# Patient Record
Sex: Male | Born: 1955 | Race: White | Hispanic: No | Marital: Married | Smoking: Never smoker
Health system: Southern US, Community
[De-identification: ages and names within clinical notes are randomized; demographics above are authoritative.]

## PROBLEM LIST (undated history)

## (undated) DIAGNOSIS — I4891 Unspecified atrial fibrillation: Secondary | ICD-10-CM

## (undated) DIAGNOSIS — Z9109 Other allergy status, other than to drugs and biological substances: Secondary | ICD-10-CM

## (undated) DIAGNOSIS — J3081 Allergic rhinitis due to animal (cat) (dog) hair and dander: Secondary | ICD-10-CM

## (undated) HISTORY — DX: Other allergy status, other than to drugs and biological substances: Z91.09

## (undated) HISTORY — DX: Allergic rhinitis due to animal (cat) (dog) hair and dander: J30.81

## (undated) HISTORY — DX: Unspecified atrial fibrillation: I48.91

---
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
## 2003-05-15 ENCOUNTER — Ambulatory Visit: Admission: RE | Admit: 2003-05-15 | Discharge: 2003-05-15 | Payer: Self-pay | Admitting: Family Medicine

## 2005-06-09 ENCOUNTER — Encounter: Admission: RE | Admit: 2005-06-09 | Discharge: 2005-06-09 | Payer: Self-pay | Admitting: *Deleted

## 2007-09-10 ENCOUNTER — Encounter: Admission: RE | Admit: 2007-09-10 | Discharge: 2007-09-10 | Payer: Self-pay | Admitting: Family Medicine

## 2008-02-14 ENCOUNTER — Encounter: Admission: RE | Admit: 2008-02-14 | Discharge: 2008-02-14 | Payer: Self-pay | Admitting: Emergency Medicine

## 2013-11-26 ENCOUNTER — Encounter (HOSPITAL_BASED_OUTPATIENT_CLINIC_OR_DEPARTMENT_OTHER): Payer: Self-pay | Admitting: Emergency Medicine

## 2013-11-26 ENCOUNTER — Emergency Department (HOSPITAL_BASED_OUTPATIENT_CLINIC_OR_DEPARTMENT_OTHER)
Admission: EM | Admit: 2013-11-26 | Discharge: 2013-11-26 | Disposition: A | Discharge status: Home / Self Care | Payer: PRIVATE HEALTH INSURANCE | Attending: Emergency Medicine | Admitting: Emergency Medicine

## 2013-11-26 DIAGNOSIS — T782XXA Anaphylactic shock, unspecified, initial encounter: Secondary | ICD-10-CM

## 2013-11-26 DIAGNOSIS — L508 Other urticaria: Secondary | ICD-10-CM | POA: Insufficient documentation

## 2013-11-26 DIAGNOSIS — T4995XA Adverse effect of unspecified topical agent, initial encounter: Secondary | ICD-10-CM | POA: Insufficient documentation

## 2013-11-26 MED ORDER — PREDNISONE 10 MG PO TABS: 20.0000 mg | ORAL_TABLET | Freq: Every day | ORAL | Status: DC

## 2013-11-26 MED ORDER — DIPHENHYDRAMINE HCL 25 MG PO CAPS: 25.0000 mg | ORAL_CAPSULE | Freq: Four times a day (QID) | ORAL | Status: DC

## 2013-11-26 MED ORDER — EPINEPHRINE HCL 0.1 MG/ML IJ SOSY
0.3000 mg | PREFILLED_SYRINGE | Freq: Once | INTRAMUSCULAR | Status: DC
  Filled 2013-11-26: qty 10

## 2013-11-26 MED ORDER — EPINEPHRINE 0.3 MG/0.3ML IJ SOAJ
0.3000 mg | Freq: Once | INTRAMUSCULAR | Status: DC
  Filled 2013-11-26: qty 0.6

## 2013-11-26 MED ORDER — EPINEPHRINE 0.3 MG/0.3ML IJ SOAJ: 0.3000 mg | INTRAMUSCULAR | Status: DC | PRN

## 2013-11-26 MED ORDER — DIPHENHYDRAMINE HCL 50 MG/ML IJ SOLN
25.0000 mg | Freq: Once | INTRAMUSCULAR | Status: AC
  Administered 2013-11-26: 25 mg via INTRAVENOUS
  Filled 2013-11-26: qty 1

## 2013-11-26 MED ORDER — RANITIDINE HCL 75 MG PO TABS: 75.0000 mg | ORAL_TABLET | Freq: Every day | ORAL | Status: DC

## 2013-11-26 MED ORDER — METHYLPREDNISOLONE SODIUM SUCC 125 MG IJ SOLR
125.0000 mg | Freq: Once | INTRAMUSCULAR | Status: AC
  Administered 2013-11-26: 125 mg via INTRAVENOUS
  Filled 2013-11-26: qty 2

## 2013-11-26 MED ORDER — EPINEPHRINE HCL 1 MG/ML IJ SOLN
0.3000 mg | Freq: Once | INTRAMUSCULAR | Status: AC
  Administered 2013-11-26: 0.3 mg via SUBCUTANEOUS
  Filled 2013-11-26: qty 1

## 2013-11-26 MED ORDER — FAMOTIDINE IN NACL 20-0.9 MG/50ML-% IV SOLN
20.0000 mg | Freq: Once | INTRAVENOUS | Status: AC
  Administered 2013-11-26: 20 mg via INTRAVENOUS
  Filled 2013-11-26: qty 50

## 2013-11-26 NOTE — ED Notes (Signed)
Woke w diarrhea eariler this am,  The short time later started having chills and itching,  Broke out in hives over body,no diff breathing or swallowing

## 2013-11-26 NOTE — ED Notes (Signed)
MD at bedside. 

## 2013-11-26 NOTE — Discharge Instructions (Signed)
Anaphylactic Reaction °An anaphylactic reaction is a sudden, severe allergic reaction that involves the whole body. It can be life threatening. A hospital stay is often required. People with asthma, eczema, or hay fever are slightly more likely to have an anaphylactic reaction. °CAUSES  °An anaphylactic reaction may be caused by anything to which you are allergic. After being exposed to the allergic substance, your immune system becomes sensitized to it. When you are exposed to that allergic substance again, an allergic reaction can occur. Common causes of an anaphylactic reaction include: °· Medicines. °· Foods, especially peanuts, wheat, shellfish, milk, and eggs. °· Insect bites or stings. °· Blood products. °· Chemicals, such as dyes, latex, and contrast material used for imaging tests. °SYMPTOMS  °When an allergic reaction occurs, the body releases histamine and other substances. These substances cause symptoms such as tightening of the airway. Symptoms often develop within seconds or minutes of exposure. Symptoms may include: °· Skin rash or hives. °· Itching. °· Chest tightness. °· Swelling of the eyes, tongue, or lips. °· Trouble breathing or swallowing. °· Lightheadedness or fainting. °· Anxiety or confusion. °· Stomach pains, vomiting, or diarrhea. °· Nasal congestion. °· A fast or irregular heartbeat (palpitations). °DIAGNOSIS  °Diagnosis is based on your history of recent exposure to allergic substances, your symptoms, and a physical exam. Your caregiver may also perform blood or urine tests to confirm the diagnosis. °TREATMENT  °Epinephrine medicine is the main treatment for an anaphylactic reaction. Other medicines that may be used for treatment include antihistamines, steroids, and albuterol. In severe cases, fluids and medicine to support blood pressure may be given through an intravenous line (IV). Even if you improve after treatment, you need to be observed to make sure your condition does not get  worse. This may require a stay in the hospital. °HOME CARE INSTRUCTIONS  °· Wear a medical alert bracelet or necklace stating your allergy. °· You and your family must learn how to use an anaphylaxis kit or give an epinephrine injection to temporarily treat an emergency allergic reaction. Always carry your epinephrine injection or anaphylaxis kit with you. This can be lifesaving if you have a severe reaction. °· Do not drive or perform tasks after treatment until the medicines used to treat your reaction have worn off, or until your caregiver says it is okay. °· If you have hives or a rash: °· Take medicines as directed by your caregiver. °· You may use an over-the-counter antihistamine (diphenhydramine) as needed. °· Apply cold compresses to the skin or take baths in cool water. Avoid hot baths or showers. °SEEK MEDICAL CARE IF:  °· You develop symptoms of an allergic reaction to a new substance. Symptoms may start right away or minutes later. °· You develop a rash, hives, or itching. °· You develop new symptoms. °SEEK IMMEDIATE MEDICAL CARE IF:  °· You have swelling of the mouth, difficulty breathing, or wheezing. °· You have a tight feeling in your chest or throat. °· You develop hives, swelling, or itching all over your body. °· You develop severe vomiting or diarrhea. °· You feel faint or pass out. °This is an emergency. Use your epinephrine injection or anaphylaxis kit as you have been instructed. Call your local emergency services (911 in [place] Even if you improve after the injection, you need to be examined at a hospital emergency department. °MAKE SURE YOU:  °· Understand these instructions. °· Will watch your condition. °· Will get help right away if you are not   doing well or get worse. Document Released: 06/19/2005 Document Revised: 12/19/2011 Document Reviewed: 09/20/2011 [facility] Patient Information 2014 [place], [place].

## 2013-11-26 NOTE — ED Notes (Signed)
Report received, pt care assumed.

## 2013-11-26 NOTE — ED Provider Notes (Signed)
CSN: [id number]     Arrival date & time 11/26/13  0636 History   First MD Initiated Contact with Patient 11/26/13 0703     Chief Complaint  Patient presents with  . Allergic Reaction     (Consider location/radiation/quality/duration/timing/severity/associated sxs/prior Treatment) Patient is a 58 y.o. male presenting with allergic reaction.  Allergic Reaction Presenting symptoms: itching and rash   Rash:    Location:  Full body   Quality: itchiness and swelling     Severity:  Moderate   Onset quality:  Gradual   Duration:  2 hours   Timing:  Constant   Progression:  Worsening Severity:  Moderate Context comment:  Recently moved from [place], stayed in a new apartment last night for the first time.   Relieved by:  Nothing Worsened by:  Nothing tried Ineffective treatments:  None tried   History reviewed. No pertinent past medical history. History reviewed. No pertinent past surgical history. No family history on file. History  Substance Use Topics  . Smoking status: Never Smoker   . Smokeless tobacco: Not on file  . Alcohol Use: No    Review of Systems  Constitutional: Negative for fever.  HENT: Negative for congestion.   Respiratory: Negative for cough and shortness of breath.   Cardiovascular: Negative for chest pain.  Gastrointestinal: Positive for abdominal pain (cramping) and diarrhea. Negative for nausea and vomiting.  Skin: Positive for itching and rash.  All other systems reviewed and are negative.     Allergies  Review of patient's allergies indicates no known allergies.  Home Medications   Prior to Admission medications   Not on File   BP 111/70  Pulse 80  Temp(Src) 98.7 F (37.1 C) (Oral)  Resp 20  Ht 5\' 10"  (1.778 m)  Wt 175 lb (79.379 kg)  BMI 25.11 kg/m2  SpO2 100% Physical Exam  Nursing note and vitals reviewed. Constitutional: He is oriented to person, place, and time. He appears well-developed and well-nourished. No distress.   HENT:  Head: Normocephalic and atraumatic.  Mouth/Throat: Oropharynx is clear and moist.  No angioedema  Eyes: Conjunctivae are normal. Pupils are equal, round, and reactive to light. No scleral icterus.  Neck: Neck supple.  Cardiovascular: Normal rate, regular rhythm, normal heart sounds and intact distal pulses.   No murmur heard. Pulmonary/Chest: Effort normal and breath sounds normal. No stridor. No respiratory distress. He has no wheezes. He has no rales.  Abdominal: Soft. He exhibits no distension. There is no tenderness.  Musculoskeletal: Normal range of motion. He exhibits no edema.  Neurological: He is alert and oriented to person, place, and time.  Skin: Skin is warm and dry. Rash noted. Rash is urticarial (diffuse).  Psychiatric: He has a normal mood and affect. His behavior is normal.    ED Course  Procedures (including critical care time) Labs Review Labs Reviewed - No data to display  Imaging Review No results found.   EKG Interpretation None      MDM   Final diagnoses:  Anaphylactic reaction    58 yo male with diffuse urticarial rash and persistent diarrhea/cramping abd pain/reflux.  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  He requires treatment for anaphylaxis with IM epi due to skin involvement and GI involvement.     8:35 AM Rash near resolved.  Diarrhea and cramping abdominal pain resolved shortly after epi.  Will continue to observe.    12:37 PM observed without problems.  Rash and GI symptoms gone without recurrence.  DC with epi pen and allergy fu.    Candyce ChurnJohn David Kseniya Grunden III, MD 11/26/13 [phone]

## 2013-11-26 NOTE — ED Notes (Signed)
Pt resting quielty, bedside commode placed at bedside, pt given call light and instructed to call for assistance with transferring. Pt states "I feel sleepy..." reports relief of abd cramping.

## 2013-11-26 NOTE — ED Notes (Signed)
Diarrhea onset this am, then hives over body  Denies sob or diff swallowing,  Staying in short term apartment  From [place]

## 2017-06-28 ENCOUNTER — Other Ambulatory Visit: Payer: Self-pay | Admitting: Family Medicine

## 2017-06-28 DIAGNOSIS — R609 Edema, unspecified: Secondary | ICD-10-CM

## 2017-07-02 ENCOUNTER — Ambulatory Visit
Admission: RE | Admit: 2017-07-02 | Discharge: 2017-07-02 | Disposition: A | Discharge status: Home / Self Care | Payer: No Typology Code available for payment source | Source: Ambulatory Visit | Attending: Family Medicine | Admitting: Family Medicine

## 2017-07-02 DIAGNOSIS — R609 Edema, unspecified: Secondary | ICD-10-CM

## 2017-07-02 MED ORDER — IOPAMIDOL (ISOVUE-300) INJECTION 61%
75.0000 mL | Freq: Once | INTRAVENOUS | Status: AC | PRN
  Administered 2017-07-02: 75 mL via INTRAVENOUS

## 2017-07-04 ENCOUNTER — Other Ambulatory Visit: Payer: Self-pay | Admitting: Gastroenterology

## 2017-07-04 DIAGNOSIS — Z800 Family history of malignant neoplasm of digestive organs: Secondary | ICD-10-CM

## 2017-07-17 ENCOUNTER — Other Ambulatory Visit: Payer: Self-pay | Admitting: Family Medicine

## 2017-07-17 DIAGNOSIS — R609 Edema, unspecified: Secondary | ICD-10-CM

## 2017-11-23 ENCOUNTER — Inpatient Hospital Stay: Admission: RE | Admit: 2017-11-23 | Payer: Self-pay | Source: Ambulatory Visit

## 2017-12-07 ENCOUNTER — Ambulatory Visit
Admission: RE | Admit: 2017-12-07 | Discharge: 2017-12-07 | Disposition: A | Discharge status: Home / Self Care | Payer: Self-pay | Source: Ambulatory Visit | Attending: Gastroenterology | Admitting: Gastroenterology

## 2017-12-07 DIAGNOSIS — Z800 Family history of malignant neoplasm of digestive organs: Secondary | ICD-10-CM

## 2018-01-29 ENCOUNTER — Ambulatory Visit (INDEPENDENT_AMBULATORY_CARE_PROVIDER_SITE_OTHER)
Admission: RE | Admit: 2018-01-29 | Discharge: 2018-01-29 | Disposition: A | Discharge status: Home / Self Care | Payer: Self-pay | Source: Ambulatory Visit | Attending: Pulmonary Disease | Admitting: Pulmonary Disease

## 2018-01-29 ENCOUNTER — Ambulatory Visit (INDEPENDENT_AMBULATORY_CARE_PROVIDER_SITE_OTHER): Payer: Self-pay | Admitting: Pulmonary Disease

## 2018-01-29 ENCOUNTER — Encounter: Payer: Self-pay | Admitting: Pulmonary Disease

## 2018-01-29 VITALS — BP 128/82 | HR 61 | Ht 70.0 in | Wt 179.0 lb

## 2018-01-29 DIAGNOSIS — R918 Other nonspecific abnormal finding of lung field: Secondary | ICD-10-CM

## 2018-01-29 DIAGNOSIS — R0683 Snoring: Secondary | ICD-10-CM

## 2018-01-29 NOTE — Progress Notes (Signed)
   Subjective:    Patient ID: Tim Curry, male    DOB: 06-13-1956, 62 y.o.   MRN: [id number]  HPI    Review of Systems  Constitutional: Negative for fever and unexpected weight change.  HENT: Negative for congestion, dental problem, ear pain, nosebleeds, postnasal drip, rhinorrhea, sinus pressure, sneezing, sore throat and trouble swallowing.   Eyes: Negative for redness and itching.  Respiratory: Negative for cough, chest tightness, shortness of breath and wheezing.   Cardiovascular: Negative for palpitations and leg swelling.  Gastrointestinal: Negative for nausea and vomiting.  Genitourinary: Negative for dysuria.  Musculoskeletal: Negative for joint swelling.  Skin: Negative for rash.  Allergic/Immunologic: Positive for environmental allergies. Negative for food allergies and immunocompromised state.  Neurological: Negative for headaches.  Hematological: Does not bruise/bleed easily.  Psychiatric/Behavioral: Negative for dysphoric mood. The patient is not nervous/anxious.        Objective:   Physical Exam        Assessment & Plan:

## 2018-01-29 NOTE — Patient Instructions (Signed)
Chest xray today  Will schedule CT chest for December 2020 and follow up after that

## 2018-01-29 NOTE — Progress Notes (Signed)
[facility]  Chief Complaint  Patient presents with  . New Consult    nodule     Constitutional: BP 128/82 (BP Location: Left Arm, Cuff Size: Normal)   Pulse 61   Ht 5\' 10"  (1.778 m)   Wt 179 lb (81.2 kg)   SpO2 98%   BMI 25.68 kg/m   History of Present Illness: Tim Curry is a 62 y.o. male with lung nodule.  His father had colon cancer.  He gets virtual colonoscopy for screening.  He had this done on 12/07/17.  This showed a 6 mm nodule in LLL on prone imaging (personally reviewed by me).  This increased in size compared to imaging from 2006.  He never smoked.  He denies history of pneumonia or tuberculosis.  He has allergies to dust and cats.  No history of asthma, or other respiratory conditions.    He is from [place].  [place]  He will be moving back to [place] permanently in 2020.    He does snore.  He is not sure if he stops breathing while asleep.   Comprehensive Respiratory Exam:  Appearance - well kempt  ENMT - nasal mucosa moist, turbinates clear, midline nasal septum, no dental lesions, no gingival bleeding, no oral exudates, 1+ tonsillar hypertrophy, scalloped tongue, elongated uvula Neck - no masses, trachea midline, no thyromegaly, no elevation in JVP Respiratory - normal appearance of chest wall, normal respiratory effort w/o accessory muscle use, no dullness on percussion, no wheezing or rales CV - s1s2 regular rate and rhythm, no murmurs, no peripheral edema, no varicosities, radial pulses symmetric GI - soft, non tender, no masses, no hepatosplenomegaly Lymph - no adenopathy noted in neck and axillary areas MSK - normal muscle strength and tone, normal gait Ext - no cyanosis, clubbing, or joint inflammation noted Skin - no rashes, lesions, or ulcers Neuro - oriented to person, place, and time Psych - normal mood and affect  Discussion: He has incidental finding of lung nodule in  Lt lower lung.  This has some increase in size compared to imaging studies in 2006.    Assessment/Plan:  Left lung nodule. - will arrange for chest xray today  - if CXR is okay, then will schedule CT chest w/o contrast for December 2020  Snoring. - asked him to monitor his sleep pattern, and then let us know if he thinks he would want to have a sleep study done   Patient Instructions  Chest xray today  Will schedule CT chest for December 2020 and follow up after that      Coralyn HellingVineet Khristian Phillippi, MD [facility] 01/29/2018, 10:28 AM  Flow Sheet  Pulmonary tests:  Review of Systems: Constitutional: Negative for fever and unexpected weight change.  HENT: Negative for congestion, dental problem, ear pain, nosebleeds, postnasal drip, rhinorrhea, sinus pressure, sneezing, sore throat and trouble swallowing.   Eyes: Negative for redness and itching.  Respiratory: Negative for cough, chest tightness, shortness of breath and wheezing.   Cardiovascular: Negative for palpitations and leg swelling.  Gastrointestinal: Negative for nausea and vomiting.  Genitourinary: Negative for dysuria.  Musculoskeletal: Negative for joint swelling.  Skin: Negative for rash.  Allergic/Immunologic: Positive for environmental allergies. Negative for food allergies and immunocompromised state.  Neurological: Negative for headaches.  Hematological: Does not bruise/bleed easily.  Psychiatric/Behavioral: Negative for dysphoric mood. The patient is not nervous/anxious.    Past Medical History: He  has a past  medical history of Cat allergies and House dust mite allergy.  Past Surgical History: He  has a past surgical history that includes Appendectomy (1970).  Family History: His family history includes Colon cancer in his father.  Social History: He  reports that he has never smoked. He has never used smokeless tobacco. He reports that he does not drink alcohol or use  drugs.  Medications: Allergies as of 01/29/2018   No Known Allergies     Medication List        Accurate as of 01/29/18 10:28 AM. Always use your most recent med list.          EPINEPHrine 0.3 mg/0.3 mL Soaj injection Commonly known as:  EPIPEN Inject 0.3 mLs (0.3 mg total) into the muscle as needed.

## 2018-02-05 ENCOUNTER — Other Ambulatory Visit: Payer: Self-pay | Admitting: Gastroenterology

## 2018-02-05 ENCOUNTER — Telehealth: Payer: Self-pay | Admitting: Pulmonary Disease

## 2018-02-05 DIAGNOSIS — Z800 Family history of malignant neoplasm of digestive organs: Secondary | ICD-10-CM

## 2018-02-05 NOTE — Telephone Encounter (Signed)
Dg Chest 2 View  Result Date: 01/29/2018 CLINICAL DATA:  History of left lower lobe pulmonary nodules. New patient appointment. No current chest complaints. Nonsmoker. EXAM: CHEST - 2 VIEW COMPARISON:  CT scan of the chest of December 07, 2017 FINDINGS: The lungs are mildly hyperinflated with mild hemidiaphragm flattening. The recently described pulmonary parenchymal nodules on the lung windows of the abdominal and pelvic CT scan of December 07, 2017 are not clearly evident on this plain radiograph. There is no alveolar infiltrate. There is no pleural effusion. The heart and pulmonary vascularity are normal. The bony thorax is unremarkable. IMPRESSION: Mild hyperinflation may be voluntary or may reflect reactive airway disease or chronic bronchitis. No pulmonary parenchymal nodules or masses are observed. No other acute cardiopulmonary abnormality. As recommended in June 2019, follow-up noncontrast chest CT scanning in 6-12 months is recommended. Electronically Signed   By: David  [place]azil[place] M.D.   On: 01/29/2018 15:19     Please let him know his CXR didn't show any obvious nodules.  No change to plan of having CT chest done in December 2020.

## 2018-02-14 NOTE — Telephone Encounter (Signed)
Attempted to call patient today regarding results. I did not receive an answer at time of call. I have left a voicemail message for pt to return call. X1  

## 2018-02-15 NOTE — Telephone Encounter (Signed)
Called and spoke with pt letting him know the results of the cxr and to let him know the plan was still the same to have the ct scan 06/2019.  Pt expressed understanding. Nothing further needed.

## 2018-02-15 NOTE — Telephone Encounter (Signed)
Pt is returning call. [phone].

## 2019-06-13 ENCOUNTER — Telehealth: Payer: Self-pay | Admitting: Pulmonary Disease

## 2019-06-13 DIAGNOSIS — R918 Other nonspecific abnormal finding of lung field: Secondary | ICD-10-CM

## 2019-06-13 NOTE — Telephone Encounter (Signed)
New order placed for the CT. Nothing further needed.

## 2019-07-08 ENCOUNTER — Other Ambulatory Visit (HOSPITAL_BASED_OUTPATIENT_CLINIC_OR_DEPARTMENT_OTHER): Payer: Self-pay | Admitting: Pulmonary Disease

## 2019-07-31 ENCOUNTER — Ambulatory Visit: Payer: Self-pay

## 2019-08-04 ENCOUNTER — Ambulatory Visit (HOSPITAL_BASED_OUTPATIENT_CLINIC_OR_DEPARTMENT_OTHER): Payer: Medicare Other

## 2019-08-05 ENCOUNTER — Ambulatory Visit: Payer: Self-pay

## 2019-08-08 ENCOUNTER — Telehealth: Payer: Self-pay | Admitting: Pulmonary Disease

## 2019-08-08 ENCOUNTER — Ambulatory Visit (HOSPITAL_BASED_OUTPATIENT_CLINIC_OR_DEPARTMENT_OTHER)
Admission: RE | Admit: 2019-08-08 | Discharge: 2019-08-08 | Disposition: A | Discharge status: Home / Self Care | Payer: Medicare Other | Source: Ambulatory Visit | Attending: Pulmonary Disease | Admitting: Pulmonary Disease

## 2019-08-08 ENCOUNTER — Other Ambulatory Visit: Payer: Self-pay

## 2019-08-08 DIAGNOSIS — R918 Other nonspecific abnormal finding of lung field: Secondary | ICD-10-CM | POA: Diagnosis present

## 2019-08-08 NOTE — Telephone Encounter (Signed)
CT chest 08/08/19 >> small HH, LLL nodule 5 mm, LLL nodule 4 mm, liver cyst, benign adrenal adenoma 1.3 cm   Please schedule ROV to review CT chest results.

## 2019-08-09 ENCOUNTER — Ambulatory Visit: Payer: Medicare Other | Attending: Internal Medicine

## 2019-08-09 DIAGNOSIS — Z23 Encounter for immunization: Secondary | ICD-10-CM | POA: Insufficient documentation

## 2019-08-09 NOTE — Progress Notes (Signed)
   Covid-19 Vaccination Clinic  Name:  ELGAR SCOGGINS    MRN: [id number] DOB: 07-01-1956  08/09/2019  Mr. Gravely was observed post Covid-19 immunization for 15 minutes without incidence. He was provided with Vaccine Information Sheet and instruction to access the V-Safe system.   Mr. Donaway was instructed to call 911 with any severe reactions post vaccine: Marland Kitchen Difficulty breathing  . Swelling of your face and throat  . A fast heartbeat  . A bad rash all over your body  . Dizziness and weakness    Immunizations Administered    Name Date Dose VIS Date Route   Pfizer COVID-19 Vaccine 08/09/2019 12:46 PM 0.3 mL 06/13/2019 Intramuscular   Manufacturer: [employer], [employer]   Lot: [id number]   NDC: [address]-1

## 2019-08-11 NOTE — Telephone Encounter (Signed)
Called the patient and advised Dr. Craige Cotta wanted to see him to discuss results.Patient scheduled for 08/14/19 at 11:30. Nothing further needed at this time.

## 2019-08-14 ENCOUNTER — Other Ambulatory Visit: Payer: Self-pay

## 2019-08-14 ENCOUNTER — Ambulatory Visit: Payer: Medicare Other | Admitting: Pulmonary Disease

## 2019-08-14 ENCOUNTER — Encounter: Payer: Self-pay | Admitting: Pulmonary Disease

## 2019-08-14 VITALS — BP 124/70 | HR 64 | Ht 70.0 in | Wt 180.4 lb

## 2019-08-14 DIAGNOSIS — R918 Other nonspecific abnormal finding of lung field: Secondary | ICD-10-CM

## 2019-08-14 NOTE — Patient Instructions (Signed)
Follow up as needed

## 2019-08-14 NOTE — Progress Notes (Signed)
[facility]  Chief Complaint  Patient presents with  . Follow-up    Patient is here to go over CT results. Patient feels good overall    Constitutional:  BP 124/70 (BP Location: Right Arm, Patient Position: Sitting, Cuff Size: Normal)   Pulse 64   Ht 5\' 10"  (1.778 m)   Wt 180 lb 6.4 oz (81.8 kg)   SpO2 99% Comment: on RA  BMI 25.88 kg/m   Past Medical History:  Allergies  Brief Summary:  Tim Curry is a 64 y.o. male with lung nodule.  Had CT chest earlier this month.  Stable nodule LLL.   Physical Exam:   Deferred.   Assessment/Plan:   Left lung nodule. - stable, benign - no additional follow up needed   Patient Instructions  Follow up as needed  Time spent 12 minutes  76, MD [facility] Pager: [phone] 08/14/2019, 12:18 PM  Flow Sheet     Pulmonary tests:  CT chest 08/08/19 >> small HH, LLL nodule 5 mm, LLL nodule 4 mm, liver cyst, benign adrenal adenoma 1.3 cm  Medications:   Allergies as of 08/14/2019   No Known Allergies     Medication List       Accurate as of August 14, 2019 12:18 PM. If you have any questions, ask your nurse or doctor.        EPINEPHrine 0.3 mg/0.3 mL Soaj injection Commonly known as: EpiPen Inject 0.3 mLs (0.3 mg total) into the muscle as needed.       Past Surgical History:  He  has a past surgical history that includes Appendectomy (1970).  Family History:  His family history includes Colon cancer in his father.  Social History:  He  reports that he has never smoked. He has never used smokeless tobacco. He reports that he does not drink alcohol or use drugs.

## 2019-09-02 ENCOUNTER — Ambulatory Visit: Payer: Medicare Other | Attending: Internal Medicine

## 2019-09-02 DIAGNOSIS — Z23 Encounter for immunization: Secondary | ICD-10-CM | POA: Insufficient documentation

## 2019-09-02 NOTE — Progress Notes (Signed)
   Covid-19 Vaccination Clinic  Name:  Tim Curry    MRN: [id number] DOB: June 01, 1956  09/02/2019  Tim Curry was observed post Covid-19 immunization for 15 minutes without incident. He was provided with Vaccine Information Sheet and instruction to access the V-Safe system.   Tim Curry was instructed to call 911 with any severe reactions post vaccine: Marland Kitchen Difficulty breathing  . Swelling of face and throat  . A fast heartbeat  . A bad rash all over body  . Dizziness and weakness   Immunizations Administered    Name Date Dose VIS Date Route   Pfizer COVID-19 Vaccine 09/02/2019 11:44 AM 0.3 mL 06/13/2019 Intramuscular   Manufacturer: [employer], [employer]   Lot: [id number]   NDC: [address]-0

## 2020-11-22 DIAGNOSIS — Z0000 Encounter for general adult medical examination without abnormal findings: Secondary | ICD-10-CM | POA: Insufficient documentation

## 2020-11-25 DIAGNOSIS — I700 Atherosclerosis of aorta: Secondary | ICD-10-CM | POA: Insufficient documentation

## 2020-11-25 DIAGNOSIS — R911 Solitary pulmonary nodule: Secondary | ICD-10-CM | POA: Insufficient documentation

## 2020-11-30 DIAGNOSIS — N4889 Other specified disorders of penis: Secondary | ICD-10-CM | POA: Insufficient documentation

## 2021-03-30 IMAGING — CT CT CHEST W/O CM
2 of 3 series · 15 of 36 positions shown, 18 images · non-contrast
Comparison: 12/07/2017

CLINICAL DATA: Follow-up pulmonary nodule identified on virtual
colonoscopy.

EXAM:
CT CHEST WITHOUT CONTRAST
TECHNIQUE: Multidetector CT imaging of the chest was performed following the
standard protocol without IV contrast.

[Series 2: thorax · axial · 0.85mm/px · z∈[-174,+148]mm · 12 of 190 slices shown, 15 images]
[im 15/190  mediastinal]
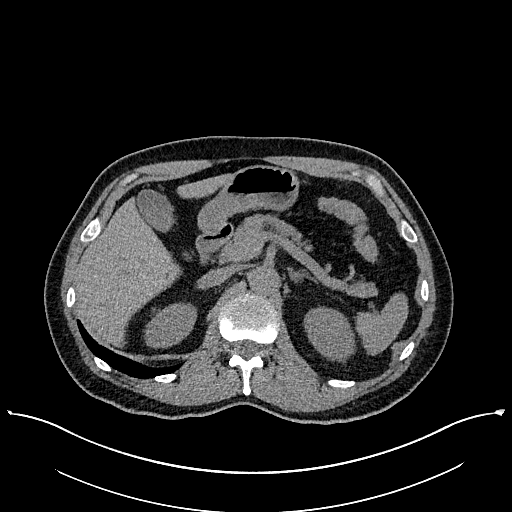
[im 15/190  lung]
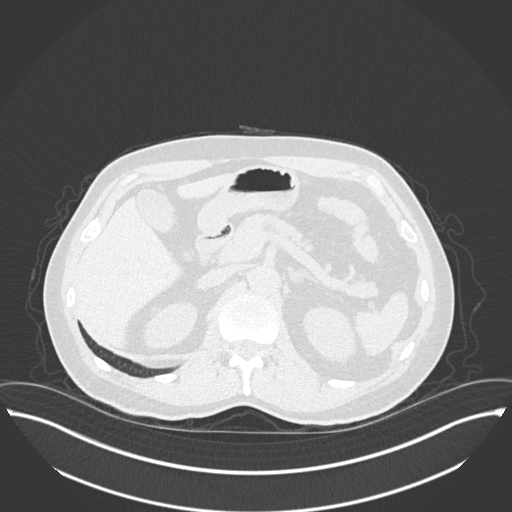
[im 29/190  lung]
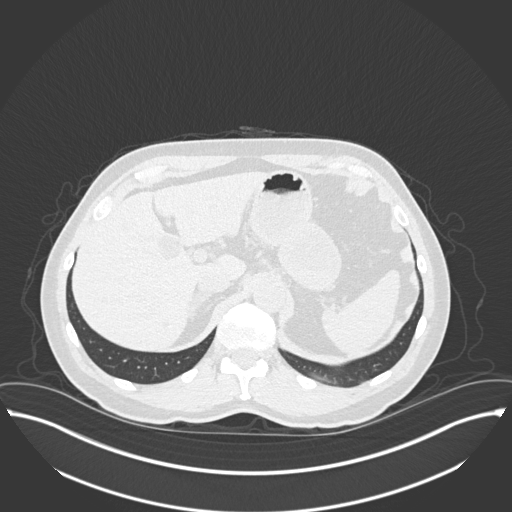
[im 43/190  lung]
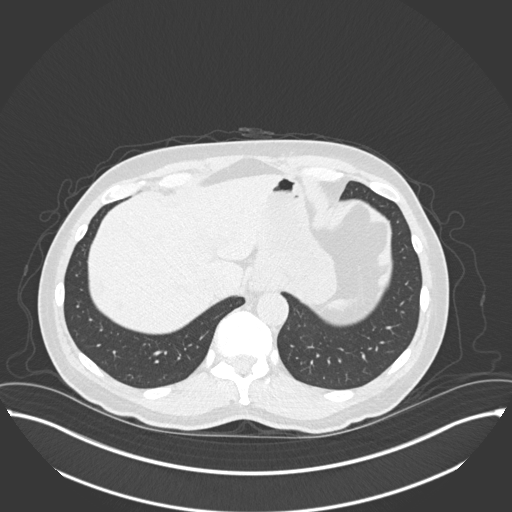
[im 57/190  lung]
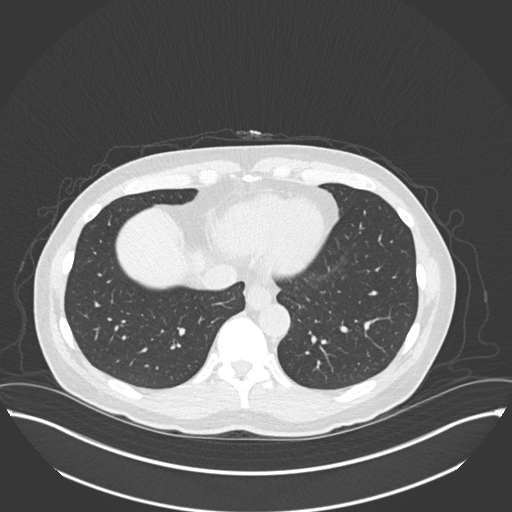
[im 71/190  mediastinal]
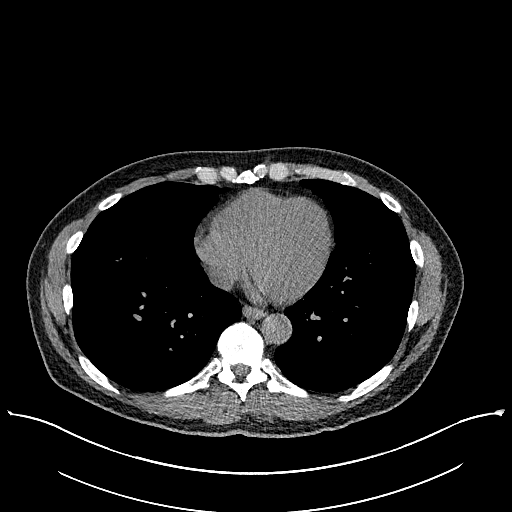
[im 71/190  lung]
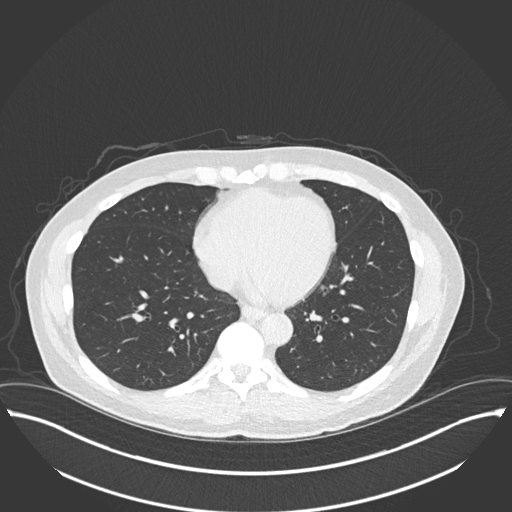
[im 85/190  lung]
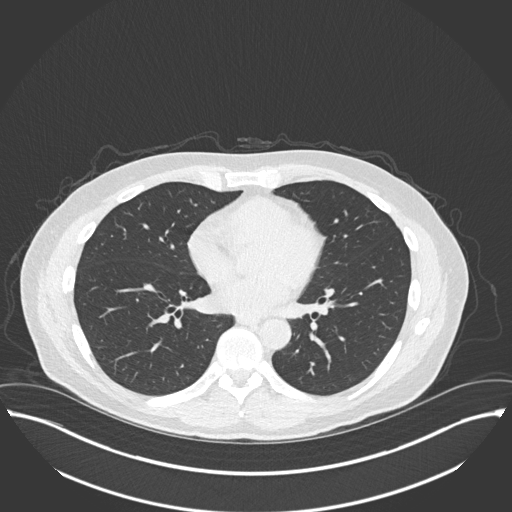
[im 106/190  lung]
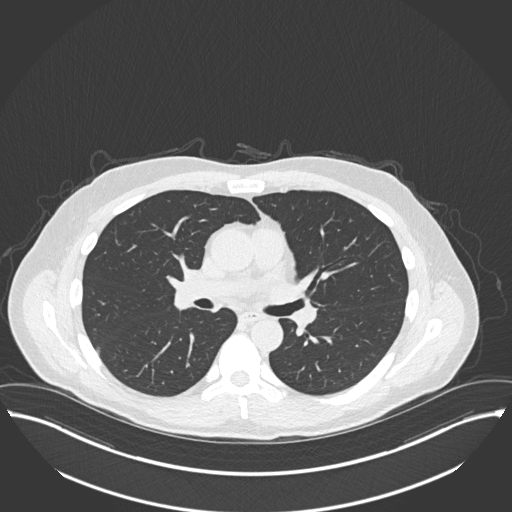
[im 120/190  lung]
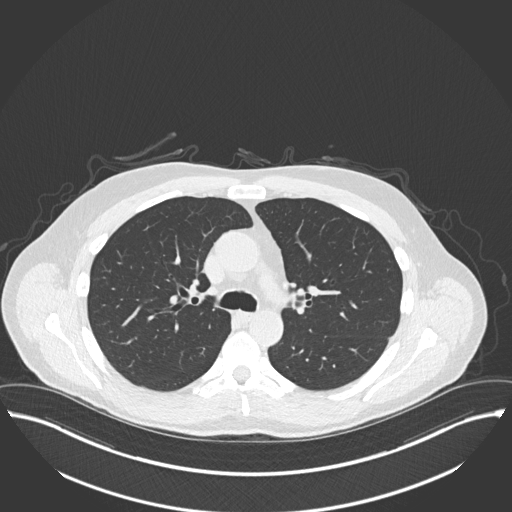
[im 134/190  mediastinal]
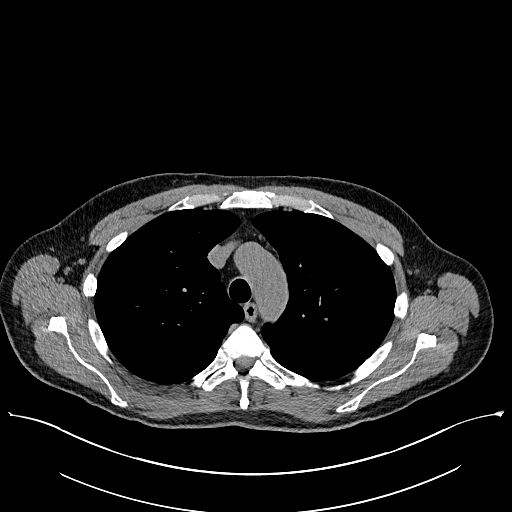
[im 134/190  lung]
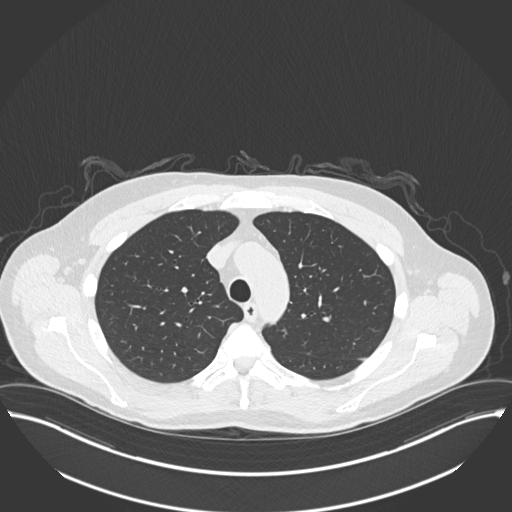
[im 148/190  lung]
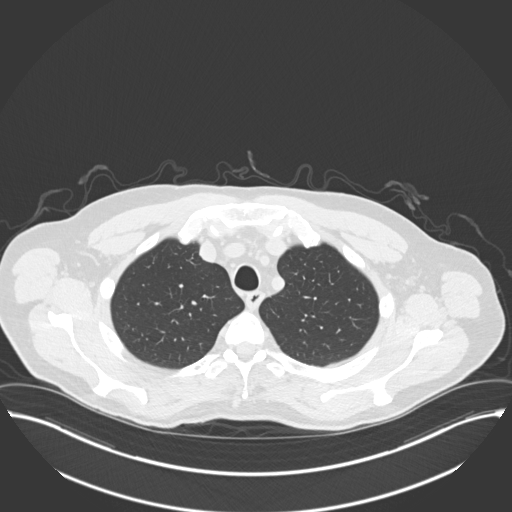
[im 162/190  lung]
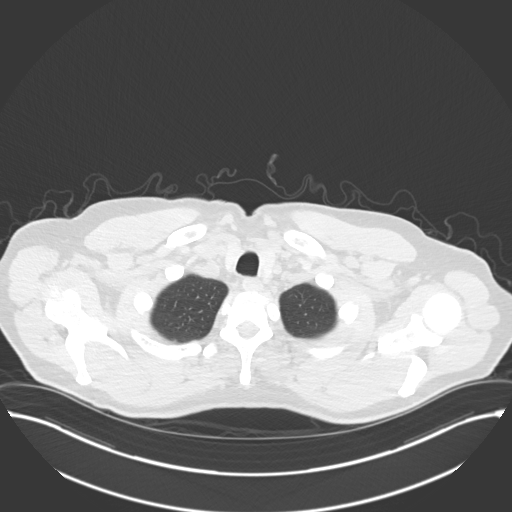
[im 176/190  lung]
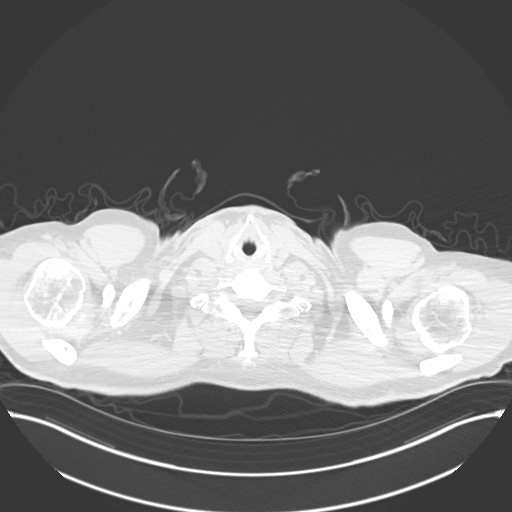

[Series 5: coronal · coronal · 0.74mm/px · 3 of 149 slices shown]
[im 30/149  lung]
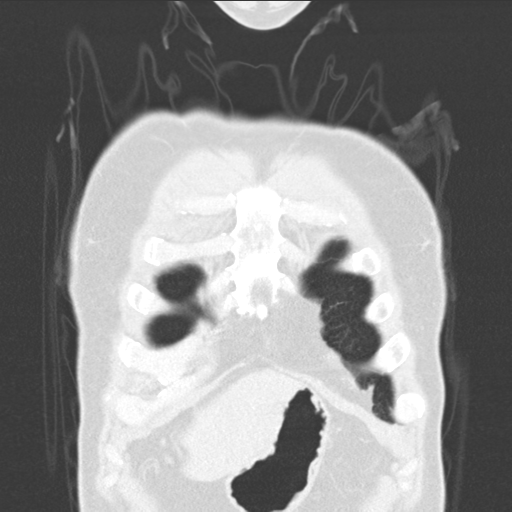
[im 60/149  lung]
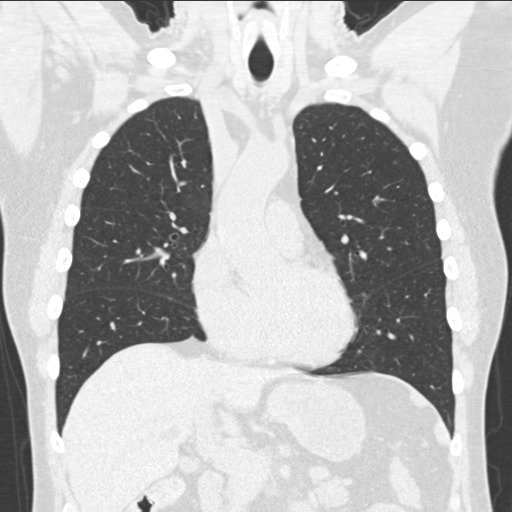
[im 89/149  lung]
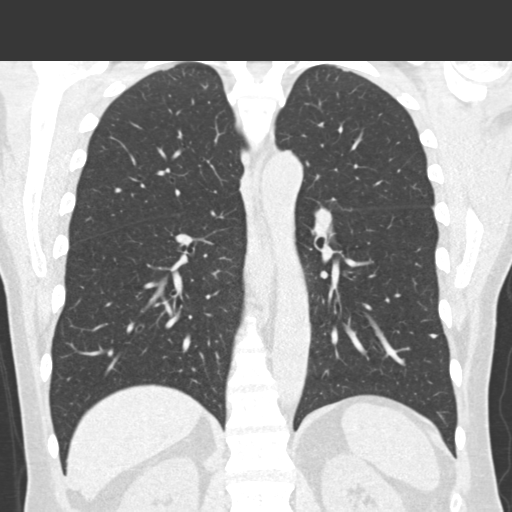

[15 of 36 positions shown; findings below may reference images not displayed]

FINDINGS: Cardiovascular: The heart size appears normal. No pericardial
effusion.

Mediastinum/Nodes: Normal appearance of the thyroid gland. The
trachea appears patent and is midline. Small hiatal hernia. No
axillary, mediastinal or hilar adenopathy.

Lungs/Pleura: No pleural effusion. No airspace consolidation,
atelectasis, or pneumothorax. Index nodule in the left lower lobe
measures 5 mm, image 128/3. Stable. The adjacent lung nodule
measures 4 mm, image 125/3.

Upper Abdomen: No acute abnormality.There are scattered
low-attenuation foci within the liver these were seen previously and
are favored to represent cysts. Low-attenuation right adrenal nodule
measures 1.3 cm and is compatible with a benign adenoma

Musculoskeletal: No chest wall mass or suspicious bone lesions
identified.
IMPRESSION: 1. No acute cardiopulmonary abnormalities.
2. Stable left lower lobe lung nodules measuring up to 5 mm.
Stability reflects a benign process. No further follow-up is
indicated at this time. This recommendation follows the consensus
statement: Guidelines for Management of Small Pulmonary Nodules
Detected on CT Images: From the [HOSPITAL] 0093; Radiology

## 2021-05-16 DIAGNOSIS — L570 Actinic keratosis: Secondary | ICD-10-CM | POA: Insufficient documentation

## 2021-07-07 DIAGNOSIS — Z9889 Other specified postprocedural states: Secondary | ICD-10-CM | POA: Insufficient documentation

## 2021-07-07 DIAGNOSIS — C44529 Squamous cell carcinoma of skin of other part of trunk: Secondary | ICD-10-CM | POA: Insufficient documentation

## 2021-07-07 DIAGNOSIS — M62830 Muscle spasm of back: Secondary | ICD-10-CM | POA: Insufficient documentation

## 2021-07-07 DIAGNOSIS — Z9049 Acquired absence of other specified parts of digestive tract: Secondary | ICD-10-CM | POA: Insufficient documentation

## 2021-11-02 DIAGNOSIS — R519 Headache, unspecified: Secondary | ICD-10-CM | POA: Insufficient documentation

## 2021-11-02 DIAGNOSIS — L299 Pruritus, unspecified: Secondary | ICD-10-CM | POA: Insufficient documentation

## 2021-11-02 DIAGNOSIS — R300 Dysuria: Secondary | ICD-10-CM | POA: Insufficient documentation

## 2022-04-20 DIAGNOSIS — L821 Other seborrheic keratosis: Secondary | ICD-10-CM | POA: Insufficient documentation

## 2022-04-20 DIAGNOSIS — M79646 Pain in unspecified finger(s): Secondary | ICD-10-CM | POA: Insufficient documentation

## 2022-06-22 DIAGNOSIS — R1030 Lower abdominal pain, unspecified: Secondary | ICD-10-CM | POA: Insufficient documentation

## 2022-07-14 DIAGNOSIS — R1032 Left lower quadrant pain: Secondary | ICD-10-CM | POA: Diagnosis not present

## 2022-08-09 DIAGNOSIS — L820 Inflamed seborrheic keratosis: Secondary | ICD-10-CM | POA: Diagnosis not present

## 2022-08-09 DIAGNOSIS — D2272 Melanocytic nevi of left lower limb, including hip: Secondary | ICD-10-CM | POA: Diagnosis not present

## 2022-08-09 DIAGNOSIS — D225 Melanocytic nevi of trunk: Secondary | ICD-10-CM | POA: Diagnosis not present

## 2022-08-09 DIAGNOSIS — C44519 Basal cell carcinoma of skin of other part of trunk: Secondary | ICD-10-CM | POA: Diagnosis not present

## 2022-08-09 DIAGNOSIS — L409 Psoriasis, unspecified: Secondary | ICD-10-CM | POA: Diagnosis not present

## 2022-08-09 DIAGNOSIS — Z85828 Personal history of other malignant neoplasm of skin: Secondary | ICD-10-CM | POA: Diagnosis not present

## 2022-08-09 DIAGNOSIS — L821 Other seborrheic keratosis: Secondary | ICD-10-CM | POA: Diagnosis not present

## 2022-08-09 DIAGNOSIS — Z86018 Personal history of other benign neoplasm: Secondary | ICD-10-CM | POA: Diagnosis not present

## 2022-08-09 DIAGNOSIS — L578 Other skin changes due to chronic exposure to nonionizing radiation: Secondary | ICD-10-CM | POA: Diagnosis not present

## 2022-08-09 DIAGNOSIS — L814 Other melanin hyperpigmentation: Secondary | ICD-10-CM | POA: Diagnosis not present

## 2022-08-09 DIAGNOSIS — L853 Xerosis cutis: Secondary | ICD-10-CM | POA: Diagnosis not present

## 2022-08-09 DIAGNOSIS — R1032 Left lower quadrant pain: Secondary | ICD-10-CM | POA: Diagnosis not present

## 2022-08-09 DIAGNOSIS — D485 Neoplasm of uncertain behavior of skin: Secondary | ICD-10-CM | POA: Diagnosis not present

## 2022-08-29 DIAGNOSIS — Z1211 Encounter for screening for malignant neoplasm of colon: Secondary | ICD-10-CM | POA: Diagnosis not present

## 2022-08-29 DIAGNOSIS — K644 Residual hemorrhoidal skin tags: Secondary | ICD-10-CM | POA: Diagnosis not present

## 2022-08-29 DIAGNOSIS — K648 Other hemorrhoids: Secondary | ICD-10-CM | POA: Diagnosis not present

## 2022-08-29 DIAGNOSIS — D122 Benign neoplasm of ascending colon: Secondary | ICD-10-CM | POA: Diagnosis not present

## 2022-08-29 DIAGNOSIS — K5730 Diverticulosis of large intestine without perforation or abscess without bleeding: Secondary | ICD-10-CM | POA: Diagnosis not present

## 2022-08-29 DIAGNOSIS — Z800 Family history of malignant neoplasm of digestive organs: Secondary | ICD-10-CM | POA: Diagnosis not present

## 2022-08-31 DIAGNOSIS — C44519 Basal cell carcinoma of skin of other part of trunk: Secondary | ICD-10-CM | POA: Diagnosis not present

## 2022-08-31 DIAGNOSIS — D122 Benign neoplasm of ascending colon: Secondary | ICD-10-CM | POA: Diagnosis not present

## 2022-09-13 DIAGNOSIS — H52223 Regular astigmatism, bilateral: Secondary | ICD-10-CM | POA: Diagnosis not present

## 2022-09-13 DIAGNOSIS — H53143 Visual discomfort, bilateral: Secondary | ICD-10-CM | POA: Diagnosis not present

## 2022-09-13 DIAGNOSIS — H524 Presbyopia: Secondary | ICD-10-CM | POA: Diagnosis not present

## 2022-11-03 DIAGNOSIS — Z131 Encounter for screening for diabetes mellitus: Secondary | ICD-10-CM | POA: Diagnosis not present

## 2022-11-03 DIAGNOSIS — E7800 Pure hypercholesterolemia, unspecified: Secondary | ICD-10-CM | POA: Diagnosis not present

## 2022-11-10 DIAGNOSIS — Z86010 Personal history of colonic polyps: Secondary | ICD-10-CM | POA: Diagnosis not present

## 2022-11-10 DIAGNOSIS — Z9189 Other specified personal risk factors, not elsewhere classified: Secondary | ICD-10-CM | POA: Diagnosis not present

## 2022-11-10 DIAGNOSIS — R002 Palpitations: Secondary | ICD-10-CM | POA: Diagnosis not present

## 2022-11-10 DIAGNOSIS — Z6824 Body mass index (BMI) 24.0-24.9, adult: Secondary | ICD-10-CM | POA: Diagnosis not present

## 2022-11-10 DIAGNOSIS — Z0000 Encounter for general adult medical examination without abnormal findings: Secondary | ICD-10-CM | POA: Diagnosis not present

## 2022-11-10 DIAGNOSIS — M5442 Lumbago with sciatica, left side: Secondary | ICD-10-CM | POA: Diagnosis not present

## 2022-11-10 DIAGNOSIS — Z800 Family history of malignant neoplasm of digestive organs: Secondary | ICD-10-CM | POA: Diagnosis not present

## 2022-11-13 ENCOUNTER — Other Ambulatory Visit (HOSPITAL_BASED_OUTPATIENT_CLINIC_OR_DEPARTMENT_OTHER): Payer: Self-pay | Admitting: Family Medicine

## 2022-11-13 DIAGNOSIS — Z9189 Other specified personal risk factors, not elsewhere classified: Secondary | ICD-10-CM

## 2022-11-23 ENCOUNTER — Ambulatory Visit: Payer: Medicare Other | Admitting: Cardiology

## 2022-11-23 ENCOUNTER — Encounter: Payer: Self-pay | Admitting: Cardiology

## 2022-11-23 VITALS — BP 137/73 | HR 69 | Resp 16 | Ht 70.0 in | Wt 173.0 lb

## 2022-11-23 DIAGNOSIS — R002 Palpitations: Secondary | ICD-10-CM | POA: Diagnosis not present

## 2022-11-23 NOTE — Progress Notes (Signed)
ID:  Tim Curry, DOB Nov 16, 1955, MRN [id number]  PCP:  Sigmund Hazel, MD  Cardiologist:  Tessa Lerner, DO, [facility] (established care 11/23/22)  REASON FOR CONSULT: Palpitations  REQUESTING PHYSICIAN:  Sigmund Hazel, MD [address] [place],  [place] [address]  Chief Complaint  Patient presents with   Palpitations   New Patient (Initial Visit)    HPI  Tim Curry is a 67 y.o. Caucasian male who presents to the clinic for evaluation of palpitations at the request of Sigmund Hazel, MD.   Since retirement in 2020 patient has started to exercise/walk more.  Initially when he would overexert he would notice fluttering/palpitations in his chest and associated with dyspnea.  However, over time his dyspnea and physical endurance improved and his fluttering/palpitation also improved.  However recently he started noticing palpitations towards the end of his walk or going up a flight of stairs.  He has a pulse oximeter at home and the pulse during these episodes could be as high as 180 bpm.  He is referred to cardiology for further evaluation and management.  During these episodes he has not had any near-syncope or syncopal events.  He denies anginal chest pain or heart failure symptoms.  He denies any consumption of coffee or caffeinated beverages.  No excessive consumption of alcohol.  Denies the use of weight loss supplements, herbal supplements, over-the-counter medications, or illicits.  No known diagnosis of thyroid disease or anemia.  FUNCTIONAL STATUS: 2-3 miles about 5 days a week.    ALLERGIES: No Known Allergies  MEDICATION LIST PRIOR TO VISIT: No outpatient medications have been marked as taking for the 11/23/22 encounter (Office Visit) with Tessa Lerner, DO.     PAST MEDICAL HISTORY: Past Medical History:  Diagnosis Date   Cat allergies    House dust mite allergy     PAST SURGICAL HISTORY: Past Surgical History:  Procedure Laterality Date   APPENDECTOMY  1970     FAMILY HISTORY: The patient family history includes Colon cancer in his father; Stroke in his father.  SOCIAL HISTORY:  The patient  reports that he has never smoked. He has never used smokeless tobacco. He reports that he does not drink alcohol and does not use drugs.  REVIEW OF SYSTEMS: Review of Systems  Cardiovascular:  Positive for palpitations. Negative for chest pain, claudication, dyspnea on exertion, irregular heartbeat, leg swelling, near-syncope, orthopnea, paroxysmal nocturnal dyspnea and syncope.  Respiratory:  Negative for shortness of breath.   Hematologic/Lymphatic: Negative for bleeding problem.  Musculoskeletal:  Negative for muscle cramps and myalgias.  Neurological:  Negative for dizziness and light-headedness.    PHYSICAL EXAM:    11/23/2022    3:00 PM 08/14/2019   11:52 AM 01/29/2018    9:51 AM  Vitals with BMI  Height 5\' 10"  5\' 10"  5\' 10"   Weight 173 lbs 180 lbs 6 oz 179 lbs  BMI 24.82 25.88 25.68  Systolic 137 124 010  Diastolic 73 70 82  Pulse 69 64 61    Physical Exam  Constitutional: No distress.  Age appropriate, hemodynamically stable.   Neck: No JVD present.  Cardiovascular: Normal rate, regular rhythm, S1 normal, S2 normal, intact distal pulses and normal pulses. Exam reveals no gallop, no S3 and no S4.  No murmur heard. Pulmonary/Chest: Effort normal and breath sounds normal. No stridor. He has no wheezes. He has no rales.  Abdominal: Soft. Bowel sounds are normal. He exhibits no distension. There is no abdominal tenderness.  Musculoskeletal:        General: No edema.     Cervical back: Neck supple.  Neurological: He is alert and oriented to person, place, and time. He has intact cranial nerves (2-12).  Skin: Skin is warm and moist.   CARDIAC DATABASE: EKG: Nov 23, 2022: Normal sinus rhythm, 67 bpm, LAE, right bundle branch block.  Echocardiogram: No results found for this or any previous visit from the past 1095 days.    Stress  Testing: No results found for this or any previous visit from the past 1095 days.   Heart Catheterization: None  LABORATORY DATA: External Labs: Collected: Nov 03, 2022 provided by PCP. Total cholesterol 152, triglycerides 38, HDL 49, LDL calculated 93, non-HDL 102. Glucose 97. PSA 2.8  Lab results collected: July 14, 2022 provided by the patient. BUN 27, creatinine 1.1. Sodium 141, potassium 4.5, chloride 107, bicarb 31. AST 16, ALT 15, alkaline phosphatase 57  IMPRESSION:    ICD-10-CM   1. Palpitations  R00.2 EKG 12-Lead    LONG TERM MONITOR (3-14 DAYS)    TSH    Hemoglobin and hematocrit, blood       RECOMMENDATIONS: Tim Curry is a 67 y.o. Caucasian male without any significant past medical history who presents today for evaluation of palpitations.  Palpitations EKG illustrates sinus rhythm without ectopy. Outside labs independently reviewed. No identifiable reversible cause. Labs suggestive of possible dehydration which may be contributory.  Patient acknowledges that he could improve his hydration. 2-week cardiac monitor to evaluate for any dysrhythmias. If the Zio patch is unremarkable we discussed alternatives such as smart watch technology, Kardia mobile, or implantable loop recorder to monitor for dysrhythmias long-term.  However, patient states that he is unable to wear watches which raises the question for possible nickel allergy.  In such cases would recommend Kardia mobile and long-term monitoring if needed. Check TSH and hemoglobin to evaluate for thyroid disease and anemia respectively.  Primary care has ordered coronary calcium score which is currently scheduled for Nov 28, 2022.  Data Reviewed: I have independently reviewed external notes provided by the referring provider as part of this office visit.   I have independently reviewed results of EKG, Labs, as part of medical decision making. I have ordered the following tests:  Orders Placed This  Encounter  Procedures   TSH   Hemoglobin and hematocrit, blood   LONG TERM MONITOR (3-14 DAYS)    Standing Status:   Future    Standing Expiration Date:   11/23/2023    Order Specific Question:   Where should this test be performed?    Answer:   [facility]    Order Specific Question:   Does the patient have an implanted cardiac device?    Answer:   No    Order Specific Question:   Prescribed days of wear    Answer:   85    Order Specific Question:   Type of enrollment    Answer:   Clinic Enrollment   EKG 12-Lead   I have not made medications changes at today's encounter as noted above.  FINAL MEDICATION LIST END OF ENCOUNTER: No orders of the defined types were placed in this encounter.   Medications Discontinued During This Encounter  Medication Reason   EPINEPHrine (EPIPEN) 0.3 mg/0.3 mL IJ SOAJ injection     No current outpatient medications on file.  Orders Placed This Encounter  Procedures   TSH   Hemoglobin and hematocrit, blood   LONG  TERM MONITOR (3-14 DAYS)   EKG 12-Lead    There are no Patient Instructions on file for this visit.   --Continue cardiac medications as reconciled in final medication list. --Return in about 6 weeks (around 01/04/2023) for Follow up, Palpitations. or sooner if needed. --Continue follow-up with your primary care physician regarding the management of your other chronic comorbid conditions.  Patient's questions and concerns were addressed to his satisfaction. He voices understanding of the instructions provided during this encounter.   This note was created using a voice recognition software as a result there may be grammatical errors inadvertently enclosed that do not reflect the nature of this encounter. Every attempt is made to correct such errors.  Tessa Lerner, [place], [facility]  Pager:  [phone] Office: [phone]

## 2022-11-28 ENCOUNTER — Inpatient Hospital Stay (HOSPITAL_BASED_OUTPATIENT_CLINIC_OR_DEPARTMENT_OTHER): Admission: RE | Admit: 2022-11-28 | Payer: Medicare Other | Source: Ambulatory Visit

## 2022-12-07 ENCOUNTER — Ambulatory Visit (HOSPITAL_BASED_OUTPATIENT_CLINIC_OR_DEPARTMENT_OTHER)
Admission: RE | Admit: 2022-12-07 | Discharge: 2022-12-07 | Disposition: A | Discharge status: Home / Self Care | Payer: Medicare Other | Source: Ambulatory Visit | Attending: Family Medicine | Admitting: Family Medicine

## 2022-12-07 DIAGNOSIS — Z9189 Other specified personal risk factors, not elsewhere classified: Secondary | ICD-10-CM | POA: Insufficient documentation

## 2022-12-28 DIAGNOSIS — L910 Hypertrophic scar: Secondary | ICD-10-CM | POA: Diagnosis not present

## 2023-01-03 ENCOUNTER — Ambulatory Visit: Payer: Medicare Other

## 2023-01-03 DIAGNOSIS — R002 Palpitations: Secondary | ICD-10-CM

## 2023-01-08 DIAGNOSIS — R002 Palpitations: Secondary | ICD-10-CM | POA: Diagnosis not present

## 2023-01-09 LAB — TSH: TSH: 0.791 u[IU]/mL (ref 0.450–4.500)

## 2023-01-09 LAB — HEMOGLOBIN AND HEMATOCRIT, BLOOD
Hematocrit: 41.3 % (ref 37.5–51.0)
Hemoglobin: 13.9 g/dL (ref 13.0–17.7)

## 2023-01-11 DIAGNOSIS — H6123 Impacted cerumen, bilateral: Secondary | ICD-10-CM | POA: Diagnosis not present

## 2023-01-11 DIAGNOSIS — R0981 Nasal congestion: Secondary | ICD-10-CM | POA: Diagnosis not present

## 2023-01-12 ENCOUNTER — Ambulatory Visit: Payer: Medicare Other | Admitting: Cardiology

## 2023-01-18 ENCOUNTER — Ambulatory Visit: Payer: Medicare Other | Admitting: Cardiology

## 2023-01-29 ENCOUNTER — Telehealth: Payer: Self-pay

## 2023-01-29 DIAGNOSIS — R002 Palpitations: Secondary | ICD-10-CM | POA: Diagnosis not present

## 2023-01-29 DIAGNOSIS — I480 Paroxysmal atrial fibrillation: Secondary | ICD-10-CM

## 2023-01-29 NOTE — Telephone Encounter (Signed)
Zio rep called to let us know that patient monitor showed Rapid a-fib on 07/16@11 :30 am and had 198 beat per min lasted 60 second, you are able to see it on page 6

## 2023-01-31 DIAGNOSIS — R002 Palpitations: Secondary | ICD-10-CM | POA: Diagnosis not present

## 2023-01-31 MED ORDER — ASPIRIN 81 MG PO TBEC
81.0000 mg | DELAYED_RELEASE_TABLET | Freq: Every day | ORAL | 0 refills | Status: AC
Start: 2023-01-31 — End: 2023-05-01

## 2023-01-31 MED ORDER — DILTIAZEM HCL ER BEADS 120 MG PO CP24
120.0000 mg | ORAL_CAPSULE | Freq: Every day | ORAL | 0 refills | Status: DC
Start: 2023-01-31 — End: 2023-02-26

## 2023-01-31 NOTE — Telephone Encounter (Signed)
ON-CALL CARDIOLOGY 01/31/23  Patient's name: Tim Curry.   MRN: [id number].    DOB: Apr 22, 1956 Primary care provider: Sigmund Hazel, MD. Primary cardiologist: Tessa Lerner, DO, [facility]  Interaction regarding this patient's care today: Patient had a Zio patch as part of his palpitation workup.  Results were recently posted and he was noted to have asymptomatic episodes of atrial fibrillation with rapid ventricular rate.   Cardiac monitor [facility] Patch):  January 03, 2023 -January 17 2023  Dominant rhythm sinus, followed by atrial fibrillation (2% burden).  Sinus: Heart rate 44-129 bpm.  Avg HR 70 bpm.  Atrial fibrillation: Heart rate 64-234 bpm.  Avg HR 134 bpm.  Total A-fib burden 2%.  Longest episode of atrial fibrillation: 01/10/2023, 3 hours 25 minutes, average heart rate 117 bpm, max heart rate 197 bpm  No supraventricular tachycardia, ventricular tachycardia, high grade AV block, pauses (3 seconds or longer).  Total ventricular ectopic burden 4.9%.  Total supraventricular ectopic burden <1%.  Patient triggered events: 0.   Since the longest episode was 3 hours and 25 minutes recommend rate control strategy for now.  CHA2DS2-VASc SCORE is 1 which correlates to 0.6 % risk of stroke per year (age).   Impression:   ICD-10-CM   1. Paroxysmal atrial fibrillation (HCC)  I48.0 diltiazem (TIAZAC) 120 MG 24 hr capsule    aspirin EC 81 MG tablet    [facility] ECHOCARDIOGRAM COMPLETE      Recommendations:  Paroxysmal atrial fibrillation (HCC) Noted on Zio patch performed as part of palpitation workup. Rate control: Diltiazem 120 mg p.o. daily Rhythm control: N/A Thromboembolic prophylaxis: Aspirin [id number] SCORE is 1 which correlates to 0.6 % risk of stroke per year (age).  Echo will be ordered to evaluate for structural heart disease and left ventricular systolic function.  I have also asked him to keep a log of his blood pressures and pulse.  He also understands that if he develops  comorbidities his CHA2DS2-VASc score may indicate him to be on blood thinners.  This will need to be followed up on the longitudinal basis.   Telephone encounter total time: 10 minutes   Delilah Shan [facility]  Pager: [phone] Office: [phone]

## 2023-01-31 NOTE — Telephone Encounter (Signed)
Tim Curry,   Spoke to patient over the phone.  Can you arrange the echo prior to next office visit?  Malaka Ruffner Richards, DO, [facility]

## 2023-02-08 ENCOUNTER — Ambulatory Visit: Payer: Medicare Other | Admitting: Cardiology

## 2023-02-08 ENCOUNTER — Encounter: Payer: Self-pay | Admitting: Cardiology

## 2023-02-08 VITALS — BP 127/67 | HR 53 | Resp 16 | Ht 70.0 in | Wt 173.0 lb

## 2023-02-08 DIAGNOSIS — I480 Paroxysmal atrial fibrillation: Secondary | ICD-10-CM

## 2023-02-08 DIAGNOSIS — R002 Palpitations: Secondary | ICD-10-CM | POA: Diagnosis not present

## 2023-02-08 NOTE — Progress Notes (Signed)
ID:  Tim Curry, DOB Jun 22, 1956, MRN [id number]  PCP:  Sigmund Hazel, MD  Cardiologist:  Tessa Lerner, DO, [facility] (established care 11/23/22)  Date: 02/08/23 Last Office Visit: 11/23/2022  Chief Complaint  Patient presents with   Follow-up   Atrial Fibrillation    HPI  Tim Curry is a 67 y.o. Caucasian male whose past medical history includes paroxysmal atrial fibrillation.  After his retirement in 2020 patient started walking and exercising more and recently with overexertion and was noticing fluttering/palpitations in the chest associated with dyspnea on exertion.  However, once his endurance improved the symptoms had essentially resolved.  Prior to establishing care in May 2024 patient noted that at the end of his walks or going up a flight of stairs he would have palpitations with a heart rate as high as 180 bpm.  He was referred to cardiology for further evaluation and management.  At last office visit the shared decision was to proceed with echocardiogram and Zio patch.  Echocardiogram still pending.  Zio patch results were notable for newly discovered atrial fibrillation.  I had spoken to him in the interim and the shared decision was to check labs including TSH (which is normal) and started on diltiazem for rate control strategy.  The patient states that he is doing well on diltiazem and his heart rate has improved.  However he has nonspecific symptoms such as feeling negative (no depression or suicidal ideations), wanting to nap on a regular basis, and some musculoskeletal discomfort in the posterior shoulder towards the occiput.  FUNCTIONAL STATUS: 2-3 miles about 5 days a week.    ALLERGIES: No Known Allergies  MEDICATION LIST PRIOR TO VISIT: Current Meds  Medication Sig   aspirin EC 81 MG tablet Take 1 tablet (81 mg total) by mouth daily. Swallow whole.   diltiazem (TIAZAC) 120 MG 24 hr capsule Take 1 capsule (120 mg total) by mouth daily.     PAST MEDICAL  HISTORY: Past Medical History:  Diagnosis Date   Atrial fibrillation (HCC)    Cat allergies    House dust mite allergy     PAST SURGICAL HISTORY: Past Surgical History:  Procedure Laterality Date   APPENDECTOMY  1970    FAMILY HISTORY: The patient family history includes Colon cancer in his father; Stroke in his father.  SOCIAL HISTORY:  The patient  reports that he has never smoked. He has never used smokeless tobacco. He reports that he does not drink alcohol and does not use drugs.  REVIEW OF SYSTEMS: Review of Systems  Cardiovascular:  Negative for chest pain, claudication, dyspnea on exertion, irregular heartbeat, leg swelling, near-syncope, orthopnea, palpitations, paroxysmal nocturnal dyspnea and syncope.  Respiratory:  Negative for shortness of breath.   Hematologic/Lymphatic: Negative for bleeding problem.  Musculoskeletal:  Negative for muscle cramps and myalgias.  Neurological:  Negative for dizziness and light-headedness.    PHYSICAL EXAM:    02/08/2023    3:00 PM 11/23/2022    3:00 PM 08/14/2019   11:52 AM  Vitals with BMI  Height 5\' 10"  5\' 10"  5\' 10"   Weight 173 lbs 173 lbs 180 lbs 6 oz  BMI 24.82 24.82 25.88  Systolic 127 137 132  Diastolic 67 73 70  Pulse 53 69 64    Physical Exam  Constitutional: No distress.  Age appropriate, hemodynamically stable.   Neck: No JVD present.  Cardiovascular: Regular rhythm, S1 normal, S2 normal, intact distal pulses and normal pulses. Bradycardia present. Exam reveals no  gallop, no S3 and no S4.  No murmur heard. Pulmonary/Chest: Effort normal and breath sounds normal. No stridor. He has no wheezes. He has no rales.  Abdominal: Soft. Bowel sounds are normal. He exhibits no distension. There is no abdominal tenderness.  Musculoskeletal:        General: No edema.     Cervical back: Neck supple.  Neurological: He is alert and oriented to person, place, and time. He has intact cranial nerves (2-12).  Skin: Skin is warm  and moist.   CARDIAC DATABASE: EKG: Nov 23, 2022: Normal sinus rhythm, 67 bpm, LAE, right bundle branch block.  Echocardiogram: No results found for this or any previous visit from the past 1095 days.  Calcium Score:  12/07/2022 Coronary calcium score of 0. This was 0 percentile for age-, race-,and sex-matched controls. 1. Minimal hiatal hernia. 2. Benign pulmonary nodule stable from 2021 exam. Fleischner society guidelines recommends no further imaging follow-up.   Cardiac monitor [facility] Patch): January 03, 2023 -January 17 2023 Dominant rhythm sinus, followed by atrial fibrillation (2% burden). Sinus: Heart rate 44-129 bpm.  Avg HR 70 bpm. Atrial fibrillation: Heart rate 64-234 bpm.  Avg HR 134 bpm. Total A-fib burden 2%. Longest episode of atrial fibrillation: 01/10/2023, 3 hours 25 minutes, average heart rate 117 bpm, max heart rate 197 bpm No supraventricular tachycardia, ventricular tachycardia, high grade AV block, pauses (3 seconds or longer). Total ventricular ectopic burden 4.9%. Total supraventricular ectopic burden <1%. Patient triggered events: 0.   LABORATORY DATA: External Labs: Collected: Nov 03, 2022 provided by PCP. Total cholesterol 152, triglycerides 38, HDL 49, LDL calculated 93, non-HDL 102. Glucose 97. PSA 2.8  Lab results collected: July 14, 2022 provided by the patient. BUN 27, creatinine 1.1. Sodium 141, potassium 4.5, chloride 107, bicarb 31. AST 16, ALT 15, alkaline phosphatase 57  IMPRESSION:    ICD-10-CM   1. Paroxysmal atrial fibrillation (HCC)  I48.0     2. Palpitations  R00.2         RECOMMENDATIONS: Tim Curry is a 67 y.o. Caucasian male whose past medical history as paroxysmal atrial fibrillation.  Paroxysmal atrial fibrillation (HCC) Diagnosed during workup of palpitations and a Zio patch in July 2024 Rate control: Diltiazem. Rhythm control: N/A. Thromboembolic prophylaxis: Aspirin [id number] SCORE is 1 which correlates to  0.6 % risk of stroke per year (age).  Ventricular rate is well-controlled on current dose of diltiazem - Heart rate ranges between 61-70 bpm. Patient understands that if and when he develops other chronic comorbid conditions his CHA2DS2-VASc score will need to be reevaluated to see if anticoagulation is warranted. TSH within normal limits.  Patient also had a coronary calcium score and his total CAC is 0 which is reassuring.   He does not have any anginal chest pain or heart failure symptoms and with a preserved LVEF we will hold off on stress testing at this time his overall functional capacity remains stable.  FINAL MEDICATION LIST END OF ENCOUNTER: No orders of the defined types were placed in this encounter.   There are no discontinued medications.    Current Outpatient Medications:    aspirin EC 81 MG tablet, Take 1 tablet (81 mg total) by mouth daily. Swallow whole., Disp: 90 tablet, Rfl: 0   diltiazem (TIAZAC) 120 MG 24 hr capsule, Take 1 capsule (120 mg total) by mouth daily., Disp: 30 capsule, Rfl: 0  No orders of the defined types were placed in this encounter.   There are no  Patient Instructions on file for this visit.   --Continue cardiac medications as reconciled in final medication list. --Return in about 6 months (around 08/11/2023) for Follow up, A. fib. or sooner if needed. --Continue follow-up with your primary care physician regarding the management of your other chronic comorbid conditions.  Patient's questions and concerns were addressed to his satisfaction. He voices understanding of the instructions provided during this encounter.   This note was created using a voice recognition software as a result there may be grammatical errors inadvertently enclosed that do not reflect the nature of this encounter. Every attempt is made to correct such errors.  Tessa Lerner, [place], [facility]  Pager:  [phone] Office: [phone]

## 2023-02-26 ENCOUNTER — Other Ambulatory Visit: Payer: Self-pay | Admitting: Cardiology

## 2023-02-26 DIAGNOSIS — I480 Paroxysmal atrial fibrillation: Secondary | ICD-10-CM

## 2023-02-27 ENCOUNTER — Ambulatory Visit: Payer: Medicare Other

## 2023-02-27 DIAGNOSIS — I480 Paroxysmal atrial fibrillation: Secondary | ICD-10-CM

## 2023-02-27 DIAGNOSIS — M5451 Vertebrogenic low back pain: Secondary | ICD-10-CM | POA: Diagnosis not present

## 2023-02-27 DIAGNOSIS — M542 Cervicalgia: Secondary | ICD-10-CM | POA: Diagnosis not present

## 2023-03-15 ENCOUNTER — Other Ambulatory Visit: Payer: Self-pay | Admitting: Cardiology

## 2023-03-15 DIAGNOSIS — I480 Paroxysmal atrial fibrillation: Secondary | ICD-10-CM

## 2023-04-04 DIAGNOSIS — M67441 Ganglion, right hand: Secondary | ICD-10-CM | POA: Diagnosis not present

## 2023-04-10 DIAGNOSIS — L820 Inflamed seborrheic keratosis: Secondary | ICD-10-CM | POA: Diagnosis not present

## 2023-04-10 DIAGNOSIS — L570 Actinic keratosis: Secondary | ICD-10-CM | POA: Diagnosis not present

## 2023-04-10 DIAGNOSIS — L910 Hypertrophic scar: Secondary | ICD-10-CM | POA: Diagnosis not present

## 2023-04-16 DIAGNOSIS — M67441 Ganglion, right hand: Secondary | ICD-10-CM | POA: Diagnosis not present

## 2023-04-16 DIAGNOSIS — M6748 Ganglion, other site: Secondary | ICD-10-CM | POA: Diagnosis not present

## 2023-04-18 ENCOUNTER — Other Ambulatory Visit: Payer: Self-pay | Admitting: Cardiology

## 2023-04-18 DIAGNOSIS — I480 Paroxysmal atrial fibrillation: Secondary | ICD-10-CM

## 2023-05-02 DIAGNOSIS — K1120 Sialoadenitis, unspecified: Secondary | ICD-10-CM | POA: Diagnosis not present

## 2023-05-04 DIAGNOSIS — K1120 Sialoadenitis, unspecified: Secondary | ICD-10-CM | POA: Diagnosis not present

## 2023-06-29 DIAGNOSIS — L600 Ingrowing nail: Secondary | ICD-10-CM | POA: Diagnosis not present

## 2023-08-10 ENCOUNTER — Ambulatory Visit: Payer: Medicare Other | Attending: Cardiology | Admitting: Cardiology

## 2023-08-10 ENCOUNTER — Telehealth: Payer: Self-pay | Admitting: Radiology

## 2023-08-10 ENCOUNTER — Encounter: Payer: Self-pay | Admitting: Cardiology

## 2023-08-10 VITALS — BP 112/66 | HR 62 | Resp 16 | Ht 70.0 in | Wt 177.2 lb

## 2023-08-10 DIAGNOSIS — I480 Paroxysmal atrial fibrillation: Secondary | ICD-10-CM | POA: Diagnosis not present

## 2023-08-10 MED ORDER — DILTIAZEM HCL ER BEADS 180 MG PO CP24: 180.0000 mg | ORAL_CAPSULE | Freq: Every day | ORAL | 3 refills | Status: DC

## 2023-08-10 NOTE — Telephone Encounter (Signed)
 1. Patient agreement reviewed and signed on 08/10/23. 2. WatchPAT issued to patient on 08/10/23 By Rockie RAMAN. Patient aware to not open the WatchPAT box until contacted with the activation PIN. 3. Patient profile initialized in CloudPAT  4. Device serial number: [id number]

## 2023-08-10 NOTE — Patient Instructions (Signed)
 Medication Instructions:  INCREASE Diltiazem  to 180 mg take one tablet by mouth daily  *If you need a refill on your cardiac medications before your next appointment, please call your pharmacy*   Testing/Procedures: [place] Sleep Study   Your physician has recommended that you have a sleep study. This test records several body functions during sleep, including: brain activity, eye movement, oxygen and carbon dioxide blood levels, heart rate and rhythm, breathing rate and rhythm, the flow of air through your mouth and nose, snoring, body muscle movements, and chest and belly movement.    Follow-Up: At [facility], you and your health needs are our priority.  As part of our continuing mission to provide you with exceptional heart care, we have created designated Provider Care Teams.  These Care Teams include your primary Cardiologist (physician) and Advanced Practice Providers (APPs -  Physician Assistants and Nurse Practitioners) who all work together to provide you with the care you need, when you need it.  Your next appointment:   1 year(s)  Provider:   Madonna Large, DO     Other Instructions     1st Floor: - Lobby - Registration  - Pharmacy  - Lab - Cafe  2nd Floor: - PV Lab - Diagnostic Testing (echo, CT, nuclear med)  3rd Floor: - Vacant  4th Floor: - TCTS (cardiothoracic surgery) - AFib Clinic - Structural Heart Clinic - Vascular Surgery  - Vascular Ultrasound  5th Floor: - [facility] (general and EP) - Clinical Pharmacy for coumadin, hypertension, lipid, weight-loss medications, and med management appointments    Valet parking services will be available as well.

## 2023-08-10 NOTE — Progress Notes (Signed)
 Cardiology Office Note:  .   Date:  08/10/2023  ID:  Laurens, Matheny 10/29/1955, MRN [id number] PCP:  Cleotilde Planas, MD  Former Cardiology Providers: None [facility] Providers Cardiologist:  Curry Large, DO , [facility] (established care 11/23/22 ) Electrophysiologist:  None  Click to update primary MD,subspecialty MD or APP then REFRESH:1}    Chief Complaint  Patient presents with   Paroxysmal atrial fibrillation   Follow-up    History of Present Illness: Tim Curry   Tim Curry is a 68 y.o. Caucasian male whose past medical history and cardiovascular risk factors includes: paroxysmal atrial fibrillation.   In May 2024 patient was experiencing palpitations with heart rates up to 180 bpm and was referred to cardiology for further evaluation and management.  He underwent a Zio patch to evaluate for dysrhythmias and was noted to have paroxysmal atrial fibrillation.  Patient was started on diltiazem  120 mg p.o. daily for rate control strategy and aspirin  81 mg p.o. daily given his CHA2DS2-VASc score.  No identifiable reversible cause.  He presents today for 65-month follow-up visit.  Since starting AV nodal blocking agents patient states that he feels better but at times he still appreciates heart rates as high as 120-154 bpm.  He does use Kardia mobile to monitor his rhythm as well as a pulse ox.  The episodes of elevated heart rate could be precipitated by exertional walking, in the middle of the night, when he experiences increasing urinary frequency.  He denies any excessive alcohol consumption.  No prior workup for sleep apnea.   Review of Systems: .   Review of Systems  Cardiovascular:  Negative for chest pain, claudication, irregular heartbeat, leg swelling, near-syncope, orthopnea, palpitations, paroxysmal nocturnal dyspnea and syncope.  Respiratory:  Negative for shortness of breath.   Hematologic/Lymphatic: Negative for bleeding problem.    Studies Reviewed:    EKG: EKG Interpretation Date/Time:  Friday August 10 2023 09:45:39 EST Ventricular Rate:  63 PR Interval:  128 QRS Duration:  90 QT Interval:  376 QTC Calculation: 384 R Axis:   56  Text Interpretation: Normal sinus rhythm Normal ECG No previous ECGs available Confirmed by Tim Curry [phone]) on 08/10/2023 9:57:25 AM  Echocardiogram: 02/27/2023: Normal LV systolic function with visual EF 60-65%. Left ventricle cavity is normal in size. Normal left ventricular wall thickness. Normal global wall motion. Normal diastolic filling pattern, normal LAP.  Native trileaflet aortic valve with no regurgitation. No evidence of aortic stenosis. Systolic anterior motion noted. Mild pulmonic regurgitation. No prior study for comparison.   Calcium Score:  12/07/2022 Coronary calcium score of 0. This was 0 percentile for age-, race-,and sex-matched controls. 1. Minimal hiatal hernia. 2. Benign pulmonary nodule stable from 2021 exam. Fleischner society guidelines recommends no further imaging follow-up.     Cardiac monitor (Zio Patch): January 03, 2023 -January 17 2023 Dominant rhythm sinus, followed by atrial fibrillation (2% burden). Sinus: Heart rate 44-129 bpm.  Avg HR 70 bpm. Atrial fibrillation: Heart rate 64-234 bpm.  Avg HR 134 bpm. Total A-fib burden 2%. Longest episode of atrial fibrillation: 01/10/2023, 3 hours 25 minutes, average heart rate 117 bpm, max heart rate 197 bpm No supraventricular tachycardia, ventricular tachycardia, high grade AV block, pauses (3 seconds or longer). Total ventricular ectopic burden 4.9%. Total supraventricular ectopic burden <1%. Patient triggered events: 0.   RADIOLOGY: NA  Risk Assessment/Calculations:   Click Here to Calculate/Change CHADS2VASc Score The patient's CHADS2-VASc score is 1, indicating a 0.6% annual risk of  stroke.    Labs:    External Labs: Collected: Nov 03, 2022 provided by PCP. Total cholesterol 152, triglycerides 38, HDL 49, LDL  calculated 93, non-HDL 102. Glucose 97. PSA 2.8   Lab results collected: July 14, 2022 provided by the patient. BUN 27, creatinine 1.1. Sodium 141, potassium 4.5, chloride 107, bicarb 31. AST 16, ALT 15, alkaline phosphatase 57   Physical Exam:    Today's Vitals   08/10/23 0942  BP: 112/66  Pulse: 62  Resp: 16  SpO2: 97%  Weight: 177 lb 3.2 oz (80.4 kg)  Height: 5' 10 (1.778 m)   Body mass index is 25.43 kg/m. Wt Readings from Last 3 Encounters:  08/10/23 177 lb 3.2 oz (80.4 kg)  02/08/23 173 lb (78.5 kg)  11/23/22 173 lb (78.5 kg)    Physical Exam  Constitutional: No distress.  Age appropriate, hemodynamically stable.   Neck: No JVD present.  Cardiovascular: Normal rate, regular rhythm, S1 normal, S2 normal, intact distal pulses and normal pulses. Exam reveals no gallop, no S3 and no S4.  No murmur heard. Pulmonary/Chest: Effort normal and breath sounds normal. No stridor. He has no wheezes. He has no rales.  Abdominal: Soft. Bowel sounds are normal. He exhibits no distension. There is no abdominal tenderness.  Musculoskeletal:        General: No edema.     Cervical back: Neck supple.  Neurological: He is alert and oriented to person, place, and time. He has intact cranial nerves (2-12).  Skin: Skin is warm and moist.     Impression & Recommendation(s):  Impression:   ICD-10-CM   1. Paroxysmal atrial fibrillation (HCC)  I48.0 EKG 12-Lead    Itamar Sleep Study    diltiazem  (TIAZAC ) 180 MG 24 hr capsule       Recommendation(s):  Paroxysmal atrial fibrillation (HCC) Chronic and stable Diagnosed during the workup of palpitations back in July 2024. Rate control: Diltiazem , will increase dosage to 180 mg p.o. daily. Rhythm control: N/A. Thromboembolic prophylaxis: Aspirin  The patient still experiences his ventricular rate ranging between 120-154 bpm.  Therefore the shared decision was to uptitrate diltiazem  from 120 mg p.o. daily to 180 mg p.o.  daily. Encouraged evaluation for possible sleep apnea -will order Itamar sleep study  EKG today illustrates sinus rhythm without ectopy Patient understands that if and when he develops other chronic comorbid conditions his chads Vascor may change and therefore should be reevaluated to see if anticoagulation is warranted.  Reviewed the echocardiogram his last office visit which notes preserved LVEF without any significant valvular heart disease  Total time spent: 30 minutes Reviewed last progress note from 02/08/2023 Interviewing and discussing management of paroxysmal atrial fibrillation. Up titration of medical therapy as discussed above -prescription drug management Screening for and educating the importance of being evaluated for sleep apnea. Independently reviewed the echocardiogram from 02/27/2023  Orders Placed:  Orders Placed This Encounter  Procedures   EKG 12-Lead   Itamar Sleep Study    Standing Status:   Future    Expiration Date:   08/09/2024    Does the patient have access to and comfortable using a smartphone or tablet?:   Yes - WatchPat One. Device is disposable    Final Medication List:    Meds ordered this encounter  Medications   diltiazem  (TIAZAC ) 180 MG 24 hr capsule    Sig: Take 1 capsule (180 mg total) by mouth daily.    Dispense:  90 capsule    Refill:  3    Medications Discontinued During This Encounter  Medication Reason   diltiazem  (TIAZAC ) 120 MG 24 hr capsule Reorder     Current Outpatient Medications:    aspirin  EC (ASPIRIN  ADULT LOW DOSE) 81 MG tablet, Take 81 mg by mouth daily., Disp: , Rfl:    diltiazem  (TIAZAC ) 180 MG 24 hr capsule, Take 1 capsule (180 mg total) by mouth daily., Disp: 90 capsule, Rfl: 3  Consent:   NA  Disposition:   1 year follow-up sooner if needed  His questions and concerns were addressed to his satisfaction. He voices understanding of the recommendations provided during this encounter.    Signed, Curry Large, DO,  [facility]  [facility]  [address] [place], [place] [address] 08/10/2023 1:52 PM

## 2023-08-17 ENCOUNTER — Telehealth: Payer: Self-pay | Admitting: Cardiology

## 2023-08-17 NOTE — Telephone Encounter (Signed)
Pt calling bc he has not received the PIN for his WatchPat, requesting cb

## 2023-08-20 NOTE — Telephone Encounter (Signed)
**Note De-Identified Agustine Rossitto Obfuscation** Ordering provider: Dr Odis Hollingshead Associated diagnoses: Paroxysmal atrial fibrillation-I48.0 WatchPAT PA obtained on 08/20/2023 by Judiann Celia, Lorelle Formosa, LPN. Authorization: Per the [facility] Provider Portal: [id number] Description Sleep study, unattended, simultaneous recording; heart rate, oxygen saturation, respiratory analysis (eg, by airflow or peripheral arterial tone), and sleep time Inquiry summary Notification/Prior Authorization not required for this service. Patient notified of PIN (1234) on 08/20/2023 Adeana Grilliot Notification Method: phone.  Phone note routed to covering staff for follow-up.

## 2023-08-22 ENCOUNTER — Encounter (INDEPENDENT_AMBULATORY_CARE_PROVIDER_SITE_OTHER): Payer: Medicare Other | Admitting: Cardiology

## 2023-08-22 DIAGNOSIS — I480 Paroxysmal atrial fibrillation: Secondary | ICD-10-CM

## 2023-08-22 DIAGNOSIS — R0683 Snoring: Secondary | ICD-10-CM | POA: Diagnosis not present

## 2023-08-24 ENCOUNTER — Telehealth: Payer: Self-pay

## 2023-08-24 ENCOUNTER — Ambulatory Visit: Payer: Medicare Other | Attending: Cardiology

## 2023-08-24 DIAGNOSIS — I480 Paroxysmal atrial fibrillation: Secondary | ICD-10-CM

## 2023-08-24 NOTE — Procedures (Signed)
   SLEEP STUDY REPORT Patient Information Study Date: 08/22/2023 Patient Name: Tim Curry Patient ID: [id number] Birth Date: 1956/02/28 Age: 68 Gender: Male BMI: 25.2 (W=176 lb, H=5' 10'') Stopbang: 4 Referring Physician: Tessa Lerner, DO  TEST DESCRIPTION: Home sleep apnea testing was completed using the WatchPat, a Type 1 device, utilizing  peripheral arterial tonometry (PAT), chest movement, actigraphy, pulse oximetry, pulse rate, body position and snore.  AHI was calculated with apnea and hypopnea using valid sleep time as the denominator. RDI includes apneas,  hypopneas, and RERAs. The data acquired and the scoring of sleep and all associated events were performed in  accordance with the recommended standards and specifications as outlined in the AASM Manual for the Scoring of  Sleep and Associated Events 2.2.0 (2015).   FINDINGS: 1. No evidence of Obstructive Sleep Apnea with AHI 2.4/hr.  2. No Central Sleep Apnea. 3. Oxygen desaturations as low as 91%. 4. Moderate snoring was present. O2 sats were < 88% for 0 minutes. 5. Total sleep time was 6 hrs and 42 min. 6. 17.4% of total sleep time was spent in REM sleep.  7. Normal sleep onset latency at 19 min.  8. Shortened REM sleep onset latency at 59 min.  9. Total awakenings were 14.   DIAGNOSIS:  Normal study with no significant sleep disordered breathing.  RECOMMENDATIONS: 1. Normal study with no significant sleep disordered breathing.  2. Healthy sleep recommendations include: adequate nightly sleep (normal 7-9 hrs/night), avoidance of caffeine after  noon and alcohol near bedtime, and maintaining a sleep environment that is cool, dark and quiet.  3. Weight loss for overweight patients is recommended.   4. Snoring recommendations include: weight loss where appropriate, side sleeping, and avoidance of alcohol before  bed.  5. Operation of motor vehicle or dangerous equipment must be avoided when feeling drowsy,  excessively sleepy, or  mentally fatigued.   6. An ENT consultation which may be useful for specific causes of and possible treatment of bothersome snoring .   7. Weight loss may be of benefit in reducing the severity of snoring.   Signature: Armanda Magic, MD; [facility]; Diplomat, American Board of Sleep  Medicine Electronically Signed: 08/24/2023 9:23:50 AM

## 2023-08-24 NOTE — Telephone Encounter (Signed)
Notified patient of sleep study results and recommendations. All questions (if any) were answered and patient verbalized understanding.

## 2023-08-24 NOTE — Telephone Encounter (Signed)
-----   Message from Armanda Magic sent at 08/24/2023  9:25 AM EST ----- Please let patient know that sleep study showed no significant sleep apnea.

## 2023-08-28 DIAGNOSIS — L814 Other melanin hyperpigmentation: Secondary | ICD-10-CM | POA: Diagnosis not present

## 2023-08-28 DIAGNOSIS — D485 Neoplasm of uncertain behavior of skin: Secondary | ICD-10-CM | POA: Diagnosis not present

## 2023-08-28 DIAGNOSIS — L821 Other seborrheic keratosis: Secondary | ICD-10-CM | POA: Diagnosis not present

## 2023-08-28 DIAGNOSIS — C44311 Basal cell carcinoma of skin of nose: Secondary | ICD-10-CM | POA: Diagnosis not present

## 2023-08-28 DIAGNOSIS — L409 Psoriasis, unspecified: Secondary | ICD-10-CM | POA: Diagnosis not present

## 2023-08-28 DIAGNOSIS — D2272 Melanocytic nevi of left lower limb, including hip: Secondary | ICD-10-CM | POA: Diagnosis not present

## 2023-08-28 DIAGNOSIS — C44319 Basal cell carcinoma of skin of other parts of face: Secondary | ICD-10-CM | POA: Diagnosis not present

## 2023-08-28 DIAGNOSIS — Z85828 Personal history of other malignant neoplasm of skin: Secondary | ICD-10-CM | POA: Diagnosis not present

## 2023-08-28 DIAGNOSIS — D225 Melanocytic nevi of trunk: Secondary | ICD-10-CM | POA: Diagnosis not present

## 2023-08-28 DIAGNOSIS — Z86018 Personal history of other benign neoplasm: Secondary | ICD-10-CM | POA: Diagnosis not present

## 2023-08-28 DIAGNOSIS — L578 Other skin changes due to chronic exposure to nonionizing radiation: Secondary | ICD-10-CM | POA: Diagnosis not present

## 2023-09-07 NOTE — Telephone Encounter (Signed)
 Spoke with pt and he verified that the Itamar sleep study has been completed.

## 2023-09-13 DIAGNOSIS — H31002 Unspecified chorioretinal scars, left eye: Secondary | ICD-10-CM | POA: Diagnosis not present

## 2023-09-13 DIAGNOSIS — H53143 Visual discomfort, bilateral: Secondary | ICD-10-CM | POA: Diagnosis not present

## 2023-09-13 DIAGNOSIS — H2513 Age-related nuclear cataract, bilateral: Secondary | ICD-10-CM | POA: Diagnosis not present

## 2023-09-13 DIAGNOSIS — H04123 Dry eye syndrome of bilateral lacrimal glands: Secondary | ICD-10-CM | POA: Diagnosis not present

## 2023-09-17 DIAGNOSIS — C4401 Basal cell carcinoma of skin of lip: Secondary | ICD-10-CM | POA: Diagnosis not present

## 2023-09-26 DIAGNOSIS — I480 Paroxysmal atrial fibrillation: Secondary | ICD-10-CM | POA: Diagnosis not present

## 2023-09-26 DIAGNOSIS — G4452 New daily persistent headache (NDPH): Secondary | ICD-10-CM | POA: Diagnosis not present

## 2023-09-27 ENCOUNTER — Other Ambulatory Visit: Payer: Self-pay | Admitting: Family Medicine

## 2023-09-27 DIAGNOSIS — G4452 New daily persistent headache (NDPH): Secondary | ICD-10-CM

## 2023-10-10 DIAGNOSIS — L600 Ingrowing nail: Secondary | ICD-10-CM | POA: Diagnosis not present

## 2023-10-16 ENCOUNTER — Encounter: Payer: Self-pay | Admitting: Podiatry

## 2023-10-16 ENCOUNTER — Ambulatory Visit: Admitting: Podiatry

## 2023-10-16 DIAGNOSIS — I480 Paroxysmal atrial fibrillation: Secondary | ICD-10-CM | POA: Insufficient documentation

## 2023-10-16 DIAGNOSIS — M2062 Acquired deformities of toe(s), unspecified, left foot: Secondary | ICD-10-CM

## 2023-10-16 DIAGNOSIS — L820 Inflamed seborrheic keratosis: Secondary | ICD-10-CM | POA: Diagnosis not present

## 2023-10-16 DIAGNOSIS — I499 Cardiac arrhythmia, unspecified: Secondary | ICD-10-CM | POA: Insufficient documentation

## 2023-10-16 DIAGNOSIS — L600 Ingrowing nail: Secondary | ICD-10-CM

## 2023-10-16 DIAGNOSIS — M2061 Acquired deformities of toe(s), unspecified, right foot: Secondary | ICD-10-CM

## 2023-10-16 NOTE — Patient Instructions (Signed)
 Place 1/4 cup of epsom salts in a quart of warm tap water.  Submerge your foot or feet in the solution and soak for 20 minutes.  This soak can be performed once or twice a day.  Next, remove your foot or feet from solution, blot dry the affected area.   IF YOUR SKIN BECOMES IRRITATED WHILE USING THESE INSTRUCTIONS, IT IS OKAY TO SWITCH TO  WHITE VINEGAR AND WATER.  As another alternative soak, you may use antibacterial soap and water.  Dry the area well after soaking.  Apply a small amount of antibiotic ointment to the area, followed by a Bandaid or light gauze dressing.  As the area starts to dry out over the next few days, you can remove the covering at bedtime to encourage the toe to dry out more.  Once it forms a dry scab, you can discontinue these instructions.  Monitor for any signs/symptoms of infection. Call the office immediately if any occur or go directly to the emergency room. Call with any questions/concerns.

## 2023-10-16 NOTE — Progress Notes (Signed)
 Subjective:  Patient ID: Tim Curry, male    DOB: 1956-01-03,  MRN: [id number]  Tim Curry presents to clinic today for:  Chief Complaint  Patient presents with   Toe Pain    1st (lateral border) and 2nd (medial border) bilateral - noticed tenderness about 3-4 months ago, PCP eval-Rx'd cream-got better, but after a month or so worsened again, also nails are looking brittle and breaking    New Patient (Initial Visit)   Patient presents with c/o ingrowing toenails along bilateral hallux, lateral nail margins.  States his 2nd toes are pushing against the great toes, which also leads to discomfort along the 2nd toenail medial margins.  Denies drainage.  Notes he is very active.  Denies recent injury or too narrow shoes.   PCP is Sigmund Hazel, MD.  Allergies  Allergen Reactions   Cat Dander     Other Reaction(s): respiratory distress    Review of Systems: Negative except as noted in the HPI.  Objective:  Tim Curry is a pleasant 68 y.o. male in NAD. AAO x 3.  Vascular Examination: Capillary refill time is 3-5 seconds to toes bilateral. Palpable pedal pulses b/l LE. Digital hair present b/l. No pedal edema b/l. Skin temperature gradient WNL b/l. No varicosities b/l. No cyanosis or clubbing noted b/l.   Dermatological Examination: There is incurvation of the bilateral hallux, lateral nail border.  There is pain on palpation of the affected nail border.  No drainage or erythema is noted, but the skin along the margins is hypertrophic.   Neurological Examination: Epicritic sensation intact.  Ortho Exam: There is medial drifting of the 2nd toes, at the MPJ level, with the forefoot loaded to mimic standing position.  This functional deformity is most likely what has caused the increased pressure on the lateral hallux and is pushing the skin up and over the edge of the nail  Assessment/Plan: 1. Ingrown toenail   2. Acquired deformity of right toe   3. Acquired  deformity of left toe    Discussed patient's condition today.  After obtaining patient consent, the bilateral hallux was anesthetized with a 50:50 mixture of 1% lidocaine plain and 0.5% bupivacaine plain for a total of 3cc's administered.  Upon confirmation of anesthesia, a freer elevator was utilized to free the bilateral hallux, lateral nail border from the nail bed.  The nail borders were then avulsed proximal to the eponychium and removed in toto.  The area was inspected for any remaining spicules.  A chemical matrixectomy was performed with NaOH and neutralized with acetic acid solution.  Antibiotic ointment and a DSD were applied, followed by a Coban dressing.  Patient tolerated the anesthetic and procedure well and will f/u in 2-3 weeks for recheck.  Patient given post-procedure instructions for daily 15-minute Epsom salt soaks, antibiotic ointment and daily use of Bandaids until toe starts to dry / form eschar.   Patient may need to try a first interspace toe spacer if the 2nd toe position worsens over time.     Return in about 2 weeks (around 10/30/2023) for PNA recheck.   Clerance Lav, DPM, FACFAS [facility]     [address], [place] [address]  Office [phone]  Fax [phone]

## 2023-10-30 ENCOUNTER — Ambulatory Visit: Admitting: Podiatry

## 2023-10-30 ENCOUNTER — Encounter: Payer: Self-pay | Admitting: Podiatry

## 2023-10-30 VITALS — Ht 70.0 in | Wt 177.2 lb

## 2023-10-30 DIAGNOSIS — L600 Ingrowing nail: Secondary | ICD-10-CM

## 2023-10-30 NOTE — Progress Notes (Signed)
  Subjective:  Patient ID: Tim Curry, male    DOB: Feb 21, 1956,  MRN: [id number]  Chief Complaint  Patient presents with   Ingrown Toenail    Pt states he had ingrown removed from left great toe 2 weeks ago, soaked as was instructed stop when stab form but yesterday area started bleeding and pt was concerned about that. Toe is red and swollen as well.    68 y.o. male presents with the above complaint. History confirmed with patient.  Noticed the bleeding after going for a long walk yesterday feels like they are improving overall redness is receding  Objective:  Physical Exam: warm, good capillary refill, no trophic changes or ulcerative lesions, normal DP and PT pulses, and normal sensory exam. Left Foot:  Left hallux lateral border matricectomy site is healing well scab here but no active signs of bleeding or infection   Assessment:   1. Ingrown toenail      Plan:  Patient was evaluated and treated and all questions answered.  Appears to be healing well and appropriately suspect the erythema he has is reaction to the phenolic acid did not require antibiotic therapy at this point advised him to monitor the erythema and if the receipts there is no further treatment is necessary but if that it is having any proximal spread or worsening or purulent drainage develops or the toe becomes painful which currently is not does not show signs of that he should notify me for antibiotic treatment.  Follow-up as needed.  Continue soaking and cleaning away scab and dried blood gently with a soft bristle brush 2-3 times weekly to alleviate this further.  Return if symptoms worsen or fail to improve.

## 2023-11-02 ENCOUNTER — Ambulatory Visit
Admission: RE | Admit: 2023-11-02 | Discharge: 2023-11-02 | Disposition: A | Discharge status: Home / Self Care | Source: Ambulatory Visit | Attending: Family Medicine | Admitting: Family Medicine

## 2023-11-02 DIAGNOSIS — I6782 Cerebral ischemia: Secondary | ICD-10-CM | POA: Diagnosis not present

## 2023-11-02 DIAGNOSIS — R519 Headache, unspecified: Secondary | ICD-10-CM | POA: Diagnosis not present

## 2023-11-02 DIAGNOSIS — G4452 New daily persistent headache (NDPH): Secondary | ICD-10-CM

## 2023-11-02 MED ORDER — GADOPICLENOL 0.5 MMOL/ML IV SOLN
8.0000 mL | Freq: Once | INTRAVENOUS | Status: AC | PRN
  Administered 2023-11-02: 8 mL via INTRAVENOUS

## 2023-11-03 ENCOUNTER — Other Ambulatory Visit

## 2023-11-18 ENCOUNTER — Other Ambulatory Visit

## 2023-12-03 ENCOUNTER — Ambulatory Visit: Admitting: Podiatry

## 2023-12-07 DIAGNOSIS — I4891 Unspecified atrial fibrillation: Secondary | ICD-10-CM | POA: Diagnosis not present

## 2023-12-12 DIAGNOSIS — Z860101 Personal history of adenomatous and serrated colon polyps: Secondary | ICD-10-CM | POA: Diagnosis not present

## 2023-12-12 DIAGNOSIS — Z0000 Encounter for general adult medical examination without abnormal findings: Secondary | ICD-10-CM | POA: Diagnosis not present

## 2024-02-05 DIAGNOSIS — Z961 Presence of intraocular lens: Secondary | ICD-10-CM | POA: Diagnosis not present

## 2024-02-05 DIAGNOSIS — H2511 Age-related nuclear cataract, right eye: Secondary | ICD-10-CM | POA: Diagnosis not present

## 2024-02-27 DIAGNOSIS — L820 Inflamed seborrheic keratosis: Secondary | ICD-10-CM | POA: Diagnosis not present

## 2024-04-07 DIAGNOSIS — M65331 Trigger finger, right middle finger: Secondary | ICD-10-CM | POA: Diagnosis not present

## 2024-04-07 DIAGNOSIS — L821 Other seborrheic keratosis: Secondary | ICD-10-CM | POA: Diagnosis not present

## 2024-04-07 DIAGNOSIS — B078 Other viral warts: Secondary | ICD-10-CM | POA: Diagnosis not present

## 2024-04-07 DIAGNOSIS — L700 Acne vulgaris: Secondary | ICD-10-CM | POA: Diagnosis not present

## 2024-04-07 DIAGNOSIS — L820 Inflamed seborrheic keratosis: Secondary | ICD-10-CM | POA: Diagnosis not present

## 2024-04-07 DIAGNOSIS — M67441 Ganglion, right hand: Secondary | ICD-10-CM | POA: Diagnosis not present

## 2024-07-30 ENCOUNTER — Encounter: Payer: Self-pay | Admitting: Cardiology

## 2024-07-30 DIAGNOSIS — I480 Paroxysmal atrial fibrillation: Secondary | ICD-10-CM

## 2024-07-30 MED ORDER — DILTIAZEM HCL ER BEADS 180 MG PO CP24: 180.0000 mg | ORAL_CAPSULE | Freq: Every day | ORAL | 0 refills | Status: AC

## 2024-08-08 ENCOUNTER — Other Ambulatory Visit: Payer: Self-pay | Admitting: Cardiology

## 2024-08-08 DIAGNOSIS — I480 Paroxysmal atrial fibrillation: Secondary | ICD-10-CM

## 2024-08-21 ENCOUNTER — Ambulatory Visit: Admitting: Cardiology
